# Patient Record
Sex: Female | Born: 1975 | Race: White | Hispanic: No | Marital: Married | Smoking: Former smoker
Health system: Southern US, Community
[De-identification: ages and names within clinical notes are randomized; demographics above are authoritative.]

## PROBLEM LIST (undated history)

## (undated) DIAGNOSIS — I82409 Acute embolism and thrombosis of unspecified deep veins of unspecified lower extremity: Secondary | ICD-10-CM

## (undated) DIAGNOSIS — I2699 Other pulmonary embolism without acute cor pulmonale: Secondary | ICD-10-CM

## (undated) DIAGNOSIS — N3941 Urge incontinence: Secondary | ICD-10-CM

## (undated) DIAGNOSIS — I4891 Unspecified atrial fibrillation: Secondary | ICD-10-CM

## (undated) DIAGNOSIS — T4145XA Adverse effect of unspecified anesthetic, initial encounter: Secondary | ICD-10-CM

## (undated) DIAGNOSIS — E039 Hypothyroidism, unspecified: Secondary | ICD-10-CM

## (undated) DIAGNOSIS — R7989 Other specified abnormal findings of blood chemistry: Secondary | ICD-10-CM

## (undated) DIAGNOSIS — Z87440 Personal history of urinary (tract) infections: Secondary | ICD-10-CM

## (undated) DIAGNOSIS — R222 Localized swelling, mass and lump, trunk: Secondary | ICD-10-CM

## (undated) DIAGNOSIS — T8859XA Other complications of anesthesia, initial encounter: Secondary | ICD-10-CM

## (undated) HISTORY — DX: Urge incontinence: N39.41

## (undated) HISTORY — PX: URETHRAL SLING: SHX2621

## (undated) HISTORY — DX: Personal history of urinary (tract) infections: Z87.440

## (undated) HISTORY — DX: Other specified abnormal findings of blood chemistry: R79.89

## (undated) HISTORY — PX: REMOVAL OF URINARY SLING: SHX6218

## (undated) HISTORY — PX: KNEE SURGERY: SHX244

## (undated) HISTORY — PX: REVISION URINARY SLING: SHX6213

## (undated) HISTORY — DX: Unspecified atrial fibrillation: I48.91

---

## 1999-05-16 HISTORY — PX: CHOLECYSTECTOMY: SHX55

## 2000-05-25 ENCOUNTER — Other Ambulatory Visit: Admission: RE | Admit: 2000-05-25 | Discharge: 2000-05-25 | Payer: Self-pay | Admitting: Obstetrics and Gynecology

## 2000-08-22 ENCOUNTER — Encounter: Payer: Self-pay | Admitting: Family Medicine

## 2000-08-22 ENCOUNTER — Encounter: Admission: RE | Admit: 2000-08-22 | Discharge: 2000-08-22 | Payer: Self-pay | Admitting: Family Medicine

## 2000-09-06 ENCOUNTER — Encounter: Admission: RE | Admit: 2000-09-06 | Discharge: 2000-09-06 | Payer: Self-pay | Admitting: Family Medicine

## 2000-09-06 ENCOUNTER — Encounter: Payer: Self-pay | Admitting: Family Medicine

## 2000-09-13 ENCOUNTER — Ambulatory Visit (HOSPITAL_COMMUNITY): Admission: RE | Admit: 2000-09-13 | Discharge: 2000-09-13 | Payer: Self-pay | Admitting: Family Medicine

## 2000-09-13 ENCOUNTER — Encounter: Payer: Self-pay | Admitting: Family Medicine

## 2000-09-24 ENCOUNTER — Encounter: Payer: Self-pay | Admitting: Family Medicine

## 2000-09-24 ENCOUNTER — Ambulatory Visit (HOSPITAL_COMMUNITY): Admission: RE | Admit: 2000-09-24 | Discharge: 2000-09-24 | Payer: Self-pay | Admitting: Family Medicine

## 2000-09-28 ENCOUNTER — Ambulatory Visit (HOSPITAL_COMMUNITY): Admission: RE | Admit: 2000-09-28 | Discharge: 2000-09-28 | Payer: Self-pay | Admitting: Gastroenterology

## 2000-11-05 ENCOUNTER — Observation Stay (HOSPITAL_COMMUNITY): Admission: RE | Admit: 2000-11-05 | Discharge: 2000-11-06 | Payer: Self-pay | Admitting: General Surgery

## 2000-11-05 ENCOUNTER — Encounter (INDEPENDENT_AMBULATORY_CARE_PROVIDER_SITE_OTHER): Payer: Self-pay

## 2000-11-11 ENCOUNTER — Encounter: Payer: Self-pay | Admitting: Surgery

## 2000-11-11 ENCOUNTER — Emergency Department (HOSPITAL_COMMUNITY): Admission: EM | Admit: 2000-11-11 | Discharge: 2000-11-11 | Payer: Self-pay | Admitting: Emergency Medicine

## 2000-12-18 ENCOUNTER — Ambulatory Visit (HOSPITAL_COMMUNITY): Admission: RE | Admit: 2000-12-18 | Discharge: 2000-12-18 | Payer: Self-pay | Admitting: Gastroenterology

## 2001-05-09 ENCOUNTER — Encounter: Payer: Self-pay | Admitting: Obstetrics and Gynecology

## 2001-05-09 ENCOUNTER — Ambulatory Visit (HOSPITAL_COMMUNITY): Admission: RE | Admit: 2001-05-09 | Discharge: 2001-05-09 | Payer: Self-pay | Admitting: Obstetrics and Gynecology

## 2001-08-15 ENCOUNTER — Inpatient Hospital Stay (HOSPITAL_COMMUNITY): Admission: AD | Admit: 2001-08-15 | Discharge: 2001-08-15 | Payer: Self-pay | Admitting: Obstetrics and Gynecology

## 2001-09-19 ENCOUNTER — Inpatient Hospital Stay (HOSPITAL_COMMUNITY): Admission: AD | Admit: 2001-09-19 | Discharge: 2001-09-19 | Payer: Self-pay | Admitting: Obstetrics and Gynecology

## 2001-09-23 ENCOUNTER — Inpatient Hospital Stay (HOSPITAL_COMMUNITY): Admission: AD | Admit: 2001-09-23 | Discharge: 2001-09-25 | Payer: Self-pay | Admitting: Obstetrics and Gynecology

## 2001-11-04 ENCOUNTER — Other Ambulatory Visit: Admission: RE | Admit: 2001-11-04 | Discharge: 2001-11-04 | Payer: Self-pay | Admitting: Obstetrics and Gynecology

## 2003-07-24 ENCOUNTER — Other Ambulatory Visit: Admission: RE | Admit: 2003-07-24 | Discharge: 2003-07-24 | Payer: Self-pay | Admitting: Obstetrics and Gynecology

## 2003-12-25 ENCOUNTER — Inpatient Hospital Stay (HOSPITAL_COMMUNITY): Admission: AD | Admit: 2003-12-25 | Discharge: 2003-12-25 | Payer: Self-pay | Admitting: Obstetrics and Gynecology

## 2003-12-27 ENCOUNTER — Inpatient Hospital Stay (HOSPITAL_COMMUNITY): Admission: AD | Admit: 2003-12-27 | Discharge: 2003-12-27 | Payer: Self-pay | Admitting: Obstetrics and Gynecology

## 2004-01-03 ENCOUNTER — Inpatient Hospital Stay (HOSPITAL_COMMUNITY): Admission: AD | Admit: 2004-01-03 | Discharge: 2004-01-05 | Payer: Self-pay | Admitting: Obstetrics and Gynecology

## 2005-05-04 ENCOUNTER — Emergency Department (HOSPITAL_COMMUNITY): Admission: EM | Admit: 2005-05-04 | Discharge: 2005-05-04 | Payer: Self-pay | Admitting: Emergency Medicine

## 2005-12-13 ENCOUNTER — Other Ambulatory Visit: Admission: RE | Admit: 2005-12-13 | Discharge: 2005-12-13 | Payer: Self-pay | Admitting: Obstetrics and Gynecology

## 2006-07-13 ENCOUNTER — Inpatient Hospital Stay (HOSPITAL_COMMUNITY): Admission: AD | Admit: 2006-07-13 | Discharge: 2006-07-13 | Payer: Self-pay | Admitting: Obstetrics and Gynecology

## 2006-07-23 ENCOUNTER — Inpatient Hospital Stay (HOSPITAL_COMMUNITY): Admission: AD | Admit: 2006-07-23 | Discharge: 2006-07-23 | Payer: Self-pay | Admitting: Obstetrics and Gynecology

## 2006-07-24 ENCOUNTER — Inpatient Hospital Stay (HOSPITAL_COMMUNITY): Admission: AD | Admit: 2006-07-24 | Discharge: 2006-07-24 | Payer: Self-pay | Admitting: Obstetrics and Gynecology

## 2006-07-25 ENCOUNTER — Inpatient Hospital Stay (HOSPITAL_COMMUNITY): Admission: AD | Admit: 2006-07-25 | Discharge: 2006-07-25 | Payer: Self-pay | Admitting: Obstetrics and Gynecology

## 2006-08-24 ENCOUNTER — Inpatient Hospital Stay (HOSPITAL_COMMUNITY): Admission: AD | Admit: 2006-08-24 | Discharge: 2006-08-28 | Payer: Self-pay | Admitting: Obstetrics and Gynecology

## 2006-08-26 ENCOUNTER — Encounter (INDEPENDENT_AMBULATORY_CARE_PROVIDER_SITE_OTHER): Payer: Self-pay | Admitting: Specialist

## 2007-11-24 ENCOUNTER — Ambulatory Visit (HOSPITAL_COMMUNITY): Admission: AD | Admit: 2007-11-24 | Discharge: 2007-11-24 | Payer: Self-pay | Admitting: Obstetrics and Gynecology

## 2007-11-24 ENCOUNTER — Encounter (INDEPENDENT_AMBULATORY_CARE_PROVIDER_SITE_OTHER): Payer: Self-pay | Admitting: Obstetrics and Gynecology

## 2009-04-11 ENCOUNTER — Emergency Department: Payer: Self-pay | Admitting: Unknown Physician Specialty

## 2009-05-15 HISTORY — PX: THYROIDECTOMY: SHX17

## 2010-04-28 ENCOUNTER — Ambulatory Visit: Payer: Self-pay | Admitting: Otolaryngology

## 2010-04-30 ENCOUNTER — Emergency Department: Payer: Self-pay | Admitting: Unknown Physician Specialty

## 2010-05-01 ENCOUNTER — Observation Stay: Payer: Self-pay | Admitting: Otolaryngology

## 2010-07-13 ENCOUNTER — Ambulatory Visit: Payer: Self-pay | Admitting: Otolaryngology

## 2010-09-27 NOTE — Op Note (Signed)
Penny [name], Penny [name]                ACCOUNT NO.:  [id number]   MEDICAL RECORD NO.:  [id number]          PATIENT TYPE:  AMB   LOCATION:  SDC                           FACILITY:  [facility]   PHYSICIAN:  Malachi Pro. Ambrose Mantle, M.D. DATE OF BIRTH:  [birth year]   DATE OF PROCEDURE:  11/24/2007  DATE OF DISCHARGE:                               OPERATIVE REPORT   PREOPERATIVE DIAGNOSIS:  Incomplete abortion.   POSTOPERATIVE DIAGNOSIS:  Incomplete abortion.   OPERATION:  Suction D&C.   OPERATOR:  Malachi Pro. Ambrose Mantle, MD   ANESTHESIA:  General.   The patient had come to the emergency room with hemorrhage.  I removed  quite a bit of clots from the vagina, and the cervix was not open.  While we were preparing for the operating room, the patient did pass  some what appeared to be placental tissue.  I was not certain that she  had passed all the tissue, so we brought her to the operating room,  placed under satisfactory general anesthesia, and placed in lithotomy  position.  The vulva, vagina, and perineum were prepped with Betadine  solution and draped as a sterile field.  The cervix was exposed.  The  uterus was examined, anterior slightly enlarged and the adnexa were free  of masses.  The cervix was open.  I grasped the cervix with a tenaculum,  used a sharp curette to enter the endometrial cavity, reached the  fundus, obtained a little bit of tissue with the sharp curette, then I  did a suction D&C.  There was a little bit of tissue left.  I continued  to do a little bit more suction, then replaced the sharp curette, and  found no additional tissue.  I did a final circuit with a suction #8  curved and terminated the procedure.  The patient seemed to tolerate the  procedure well and was returned to recovery room in satisfactory  condition.  We will check her blood type prior to discharge.      Malachi Pro. Ambrose Mantle, M.D.  Electronically Signed     TFH/MEDQ  D:  11/24/2007  T:  11/25/2007  Job:   [id number]

## 2010-09-30 NOTE — Op Note (Signed)
[facility]  Patient:    Penny [name], Penny [name]                      MRN: [id number] Proc. Date: 11/05/00 Adm. Date:  [id number] Attending:  Janalyn Rouse CC:         Florencia Reasons, M.D.   Operative Report  PREOPERATIVE DIAGNOSIS:  Biliary dyskinesia with strong family history of cholelithiasis.  POSTOPERATIVE DIAGNOSIS:  Biliary dyskinesia with strong family history of cholelithiasis.  OPERATION:  Laparoscopic cholecystectomy.  SURGEON:  Rose Phi. Maple Hudson, M.D.  ASSISTANT:  Currie Paris, M.D.  ANESTHESIA:  General.  DESCRIPTION OF PROCEDURE:  After suitable general endotracheal anesthesia was induced, the patient was placed in the supine position and the abdomen prepped and draped in the usual fashion.  A short transverse inferior umbilical incision was made with dissection to the midline fascia.  The fascia was incised, grasped and elevated, and the peritoneum entered.  Finger inspection revealed no adhesions.  A 0 Vicryl pursestring was then placed in the fascia. The Hasson cannula was inserted and fixed in place and then the abdomen insufflated with CO2.  Under direct vision, a 10-11 trocar was inserted in the epigastrium and two 5 mm trocars in right upper quadrant.  We were able to grasp the fundus of the gallbladder, and it was somewhat thick-walled but not inflamed.  We elevated it and then grasped the infundibulum and manipulated it.  There were no adhesions.  We opened up the peritoneum at the porta hepatis and exposed the cystic duct and the cystic artery.  After clearly identifying these, they were triply clipped and divided, and the gallbladder dissected free from the liver bed and then extracted through the umbilical incision.  There was no bleeding. We thoroughly irrigated the right upper quadrant.  We then carefully again inspected the right upper quadrant and then removed the trocars and tied the pursestring at the  umbilicus.  All of the incisions were infiltrated with 0.25% Marcaine.  Steri-Strips used for the skin.  Dressings applied.  Patient transferred to the recovery room in satisfactory condition having tolerated the procedure well. DD:  11/05/00 TD:  11/05/00 Job: [id number]

## 2010-09-30 NOTE — Discharge Summary (Signed)
[facility]  Patient:    Penny [name], Penny [name] Visit Number: [id number] MRN: [id number]          Service Type: OBS Location: 910A [id number] [id number] Attending Physician:  Michaele Offer Dictated by:   Alvino Chapel, M.D. Admit Date:  09/23/2001 Discharge Date: 09/25/2001                             Discharge Summary  DISCHARGE DIAGNOSES:          1. Term pregnancy at 37+ weeks, delivered.                               2. Status post normal spontaneous vaginal                                  delivery.  DISCHARGE MEDICATIONS:        1. Motrin 600 mg p.o. every six hours.                               2. Percocet 1-2 tablets p.o. every four hours                                  p.r.n.  DISCHARGE FOLLOWUP:           The patient is to follow up in six weeks for her routine postpartum exam.  HISTORY OF PRESENT ILLNESS:   The patient is a 35 year old G1, P0 who was admitted at 37+ weeks with a complaint of regular contractions and cervical change.  She had no leakage of fluid on admission and was admitted at 4-cm dilated and given an epidural.  Prenatal care made complicated by abdominal pain consistent with esophageal spasms, which was treated with nitroglycerin p.r.n. and Zantac.  Also, there was some anemia treated with iron.  PRENATAL LABORATORY DATA:     AB+, antibody negative, RPR nonreactive, rubella immune.  Hepatitis B surface antigen negative.  Gonorrhea negative, chlamydia negative, triple screen normal.  Group B Strep negative.  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  PAST SURGICAL HISTORY:        Knee surgery in 1998, a cholecystectomy in 2002.  PAST GYNECOLOGICAL HISTORY:   Chlamydia treated in 2001.  ALLERGIES:                    1. SULFA.                               2. CODEINE.  MEDICATIONS:                  1. Nitroglycerin p.r.n.                               2.  Zantac 150 mg b.i.d.  SOCIAL HISTORY:               The patient is separated; however, the father of the baby is here and involved.  PHYSICAL EXAMINATION:  VITAL SIGNS:                  She is afebrile with stable vital signs.  Fetal heart is reactive.  HOSPITAL COURSE:              Shortly after admission, she was examined and found to be completely effaced, 5-6 cm dilated, at a 0 station.  She had assisted rupture of membranes with clear fluid obtained and from that point progressed quickly to complete dilation.  She pushed approximately 45 minutes with a normal spontaneous vaginal delivery of a vigorous female infant over a small second-degree laceration.  Apgars were 8 and 9.  Weight was 6 pounds 13 ounces.  Placenta delivered spontaneously with a small second-degree laceration repaired with 2-0 Vicryl.  Estimated blood loss was 350 cc.  Her Cervix and rectum were intact.  The patient did very well postpartum and on postpartum day #2 was afebrile with stable vital signs.  Her bleeding was well controlled and bleeding normal.  Therefore, she was felt stable for discharge home.  The patient will followup in the office as previously stated. Dictated by:   Alvino Chapel, M.D. Attending Physician:  Michaele Offer DD:  09/25/01 TD:  09/27/01 Job: [id number]

## 2010-09-30 NOTE — Procedures (Signed)
[facility]  Patient:    Penny [name], Penny [name]                      MRN: [id number] Proc. Date: 09/28/00 Adm. Date:  [id number] Attending:  Rich Brave CC:         Joycelyn Rua, M.D., [facility]   Procedure Report  PROCEDURE:  Upper endoscopy.  INDICATIONS:  This is a 35 year old female with recurring severe epigastric pain which has now been going on for about a month on a virtually daily basis. The patient has not responded to trials of antipeptic therapy, and studies of the gallbladder have been negative.  FINDINGS:  Normal exam.  Minimal hiatal hernia present.  INFORMED CONSENT:  The nature, purpose and risks of the procedure have been discussed with the patient who provided written consent.  SEDATION:  Fentanyl 50 mcg and Versed 0.5 mg IV without arrhythmias or desaturation.  DESCRIPTION OF PROCEDURE:  The Olympus adult video endoscope was passed under direct vision.  The vocal cords looked normal.  The esophagus was easily entered and had entirely normal mucosa without evidence of reflux esophagitis, Barretts esophagus, varices, infection, or neoplasia.  No ring, stricture, or significant hiatal hernia was present, although I do think a 1 cm hiatal hernia was visualized.  The stomach was entered.  It contained no significant residual.  The gastric mucosa was unremarkable and had normal mucosa without evidence of gastritis, erosions, ulcers, polyps, or masses.  The retroflexed view of the cardia was unremarkable and in particular, there was no patulous character to the diaphragmatic hiatus.  The pylorus, duodenal bulb, and second duodenum were inspected and reinspected and appeared normal.  The scope was then removed from the patient.  No biopsies were obtained.  The patient tolerated the procedure well, and there were no apparent complications.  IMPRESSION:  Essentially normal upper endoscopy, without source of  recurring epigastric pain endoscopically evident.  PLAN: 1. Nulev for abdominal pain. 2. Empiric trial of Actigall. DD:  09/28/00 TD:  09/29/00 Job: [id number]

## 2010-09-30 NOTE — H&P (Signed)
[facility]  Patient:    Penny [name], Penny [name]                      MRN: [id number] Adm. Date:  [id number] Disc. Date: [id number] Attending:  Janalyn Rouse CC:         Florencia Reasons, M.D.   History and Physical  CHIEF COMPLAINT:  Abdominal pain.  HISTORY OF PRESENT ILLNESS:  Penny [name] is a 35 year old woman who underwent laparoscopic cholecystectomy six days ago by Rose Phi. Maple Hudson, M.D. She had been having episodes similar to her current one prior to that and apparently had had a complete workup, and no gallstones were found, but it was thought that the symptoms were mostly likely biliary since she had had multiple family members with similar episodes all of whom had a gallbladder disease. She underwent cholecystectomy. A note of the past showed mild chronic cholecystitis but no cholelithiasis.  The patient was discharged the day following surgery. Did well until about 40 hours ago when she had another episode similar to her preoperative pain which was high epigastric, almost at the xiphoid area pain. Lasted several hours, then went away. She was okay until about 3 this morning when she woke up with another episode. She took some ibuprofen, went back sleep, slept for awhile, woke up with a third episode which by the time I saw her in the emergency room had resolved. She does not really have a lot of nausea or vomiting with these episodes. No change in her bowel habits. No fever or chills, but she says they are debilitating, bringing her to tears.  PAST MEDICAL HISTORY, FAMILY HISTORY, REVIEW OF SYSTEMS:  Are unremarkable and noted in the old chart. Not redictated here.  PHYSICAL EXAMINATION:  GENERAL:  The patient is alert and oriented in no distress currently.  VITAL SIGNS:  Blood pressure 133/67, pulse 67, respirations 16, temperature 96.6.  HEENT:  Head is normocephalic. Eyes nonicteric. Pupils round and regular.  LUNGS:  Clear to  auscultation.  HEART:  Regular. No murmurs, rubs, or gallops.  ABDOMEN:  Nicely healing laparoscopic cholecystectomy incisions. No evidence of any infection. The abdomen is nondistended, soft, and completely nontender currently. Bowel sounds are normal.  LABORATORY DATA:  Laboratories and hepatobiliary scan currently pending.  IMPRESSION:  Recurrent episodic sub xiphoid pain of uncertain etiology.  PLAN:  We will review labs once they are done. If she has another episode, we will try a sublingual nitroglycerin to see if she is having some degree of esophageal spasm. Might be relieved. Assuming her labs are negative, we will probably need to get Florencia Reasons, M.D. back involved in her care.DD: 11/11/00 TD:  11/11/00 Job: [id number]

## 2010-09-30 NOTE — Procedures (Signed)
[place]  Patient:    Penny [name], Penny [name]                      MRN: [id number] Proc. Date: 12/21/00 Adm. Date:  [id number] Disc. Date: [id number] Attending:  Orland Mustard CC:         Joycelyn Rua, M.D.  Rose Phi. Maple Hudson, M.D.   Procedure Report  PROCEDURE:  Esophageal manometry.  ENDOSCOPIST:  Florencia Reasons, M.D.  INDICATIONS:  [age]-year-old female with recurring nonspecific upper abdominal and chest pain status post laparoscopic cholecystectomy by Dr. Francina Ames about six weeks ago.  FINDINGS:  Mild nonspecific esophageal motility disorder.  No major abnormalities identified and no definite source of symptoms seen.  DESCRIPTION OF PROCEDURE:  The procedure was done as an outpatient by the endoscopy nurse at the [facility] using a transnasal passage of a solid state manometry catheter.  It was advanced to the stomach and withdrawn gradually to obtain the readings.  FINDINGS: 1. Lower esophageal sphincter:  The lower esophageal sphincter had slightly    above-normal resting tone of 48 mmHg (normal up to 45 mmHg) with normal    relaxation with swallows. 2. Esophageal body:  Approximately 10% of contractions were transmitted and    another 10% were retrograde contractions, consistent with a mild    nonspecific motility disorder of the esophagus.  However, amplitude and    duration of contractions were normal, and no aperistaltic repetitive or    spontaneous activity was noted. 3. Upper esophageal sphincter:  The upper esophageal sphincter pressure was    normal at 68 mmHg.  However, coordination with swallows can not be    discerned from the available tracings.  IMPRESSION:  Mild nonspecific esophageal motility disorder characterized by some occasional retrograde and nontransmitted contractions.  The relevance to her symptoms is not known.  PLAN:  Continue sublingual nitroglycerin and acid control.  Consider  trial of calcium channel blocker if symptoms persist. DD:  12/21/00 TD:  12/23/00 Job: [id number]

## 2010-09-30 NOTE — Discharge Summary (Signed)
NAMETELESHA, Penny [name]                ACCOUNT NO.:  [id number]   MEDICAL RECORD NO.:  [id number]          PATIENT TYPE:  INP   LOCATION:  9316                          FACILITY:  [facility]   PHYSICIAN:  Sherron Monday, MD        DATE OF BIRTH:  1976/02/07   DATE OF ADMISSION:  08/24/2006  DATE OF DISCHARGE:  08/28/2006                               DISCHARGE SUMMARY   ADMISSION DIAGNOSES:  Intrauterine pregnancy at 33+ weeks, premature  rupture of membranes, delivered via spontaneous vaginal delivery.   HISTORY OF PRESENT ILLNESS:  A 35 year old, G3, P1-1-0-2, at 33-5/7  weeks with rupture of membranes while in the shower for clear fluid.  In  the office, there was positive Nitrazine, positive pool. She states she  has had good fetal movement, no vaginal bleeding and occasional  contractions. She previously received betamethasone for preterm  dilatation of being 2, 50 and -2 at approximately 26 weeks. She has been  having biweekly NST secondary to 2-vessel cord, small for gestational  age and preterm dilatation.   PAST MEDICAL HISTORY:  1. 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  2. Hypothyroidism - not on medication.   PAST SURGICAL HISTORY:  1. Significant for cholecystectomy in 2002.  2. Meniscus repair in 1997.   PAST OBSTETRICAL/GYNECOLOGICAL HISTORY:  G1 was a term spontaneous  vaginal delivery of a female infant weighing 6 pounds 13 ounces. G2 was  a 36-week spontaneous vaginal delivery of a female infant weighing 6  pounds 10 ounces. This pregnancy was complicated with preterm labor. G3  is her present pregnancy. She has no history of any abnormal Pap smears;  her last was in August of 2007. She has a history of chlamydia in 2001.   Her medications include prenatal vitamins.   ALLERGIES:  TO SULFA AND CODEINE.   SOCIAL HISTORY:  Denies alcohol, tobacco or drug use. She is married.   FAMILY HISTORY:  Her mother has ovarian cancer, congestive heart failure  in paternal  grandfather, hypertension in her mother, diabetes in  maternal grandmother.   Her LMP was December 31, 2005, giving her an [facility] of Oct 07, 2006. A 9-week  ultrasound was consistent with dates of her last menstrual period and  showed fetal heart tones. She had a ultrasound at 12 weeks and 5 days  revealing a normal neural thickness, questionable circumvallate placenta  and good fetal heart tones. On December 28, she had an ultrasound at 81-  5/7 weeks revealing normal anatomy, two-vessel cord, right lateral  placenta and a female infant. March 4, she had an ultrasound at 28-2/7  weeks revealing normal AFI, normal placenta, BPP of 8/8, some cervical  funneling with a cervical length of 2.8 with fundal pressure. An  ultrasound on August 10, 2006, 31-5/7, with an estimated weight of 1724  grams, normal AFI, cervix was stable and placenta was right lateral.   PRENATAL LABORATORY DATA:  Hemoglobin 12.9, platelets 230,000. She is AB  positive. Antibody screen negative. Gonorrhea negative. Chlamydia  negative. RPR nonreactive. Rubella negative. Cystic fibrosis screen  negative. Hepatitis  B surface antigen negative. TSH within normal  limits. HIV negative. AFP was within normal limits.   On admission physical exam, she is afebrile with stable vital signs.  Benign exam. Fetal heart tones were 150s with moderate variability. No  contractions. Visually, her cervix was unchanged. Estimated fetal weight  by ultrasound was 4 pounds 13 ounces with a normal AFI; however, she  continued to have leaking.   The plan was to admit her and give her ampicillin and erythromycin for  latency, induce her at 34 weeks. Have NICU discuss with her delivery of  a 34-week infant. The patient voiced understanding to all of this. She  had a relatively noneventful hospitalization.  On the morning of  hospital day 3 when she was actually 34 weeks when she at 3 a.m. began  complaining of increased discomfort and contractions  were noticed. She  was given an IV fluid bolus. However, with request of pain medicine, her  cervix was checked and found to be 4 cm. She was transferred to labor  and delivery and got an epidural. She was comfortable with that. Fetal  heart tones were 130s with good variability and occasional variable  decelerations with irregular contractions. Her vaginal exam at 5:00 in  the morning was 9 cm dilated, 100% effaced and +1 station. The NICU was  made aware of her progress. She rapidly progressed to complete/complete,  +2, pushed 3 times to deliver a viable female infant at 5:38 with Apgars  of 8 at one minute and 9 at five minutes and a weight of 2219 grams  which is 4 pounds 14 ounces. Infant delivered OP to LOT. Placenta  expressed intact at 5:45. Placenta was sent to pathology. Superficial  perineal laceration was repaired with 3-0 Vicryl. Labial skin tag was  removed at patient request. EBL was less than 500 mL. Her postpartum  course was relatively uncomplicated. She remained afebrile throughout.  Her hemoglobin decreased from 10.4 to 9.1. She was discharged to home on  postpartum day 2 with routine discharge instructions and numbers to call  with any questions or problems. She was also given prescription for  Motrin, Vicodin, iron and prenatal vitamins.   Her discharge information:  She is AB positive, rubella immune. Plans to  breast feed. Is unsure about contraception. Will further discuss this at  her 6-week checkup.      Sherron Monday, MD  Electronically Signed     JB/MEDQ  D:  08/28/2006  T:  08/28/2006  Job:  [id number]

## 2010-09-30 NOTE — Discharge Summary (Signed)
NAME:  Penny [name], Penny [name]                          ACCOUNT NO.:  [id number]   MEDICAL RECORD NO.:  [id number]                   PATIENT TYPE:  INP   LOCATION:  9143                                 FACILITY:  [facility]   PHYSICIAN:  Zenaida Niece, M.D.             DATE OF BIRTH:  Dec 20, 1975   DATE OF ADMISSION:  01/03/2004  DATE OF DISCHARGE:                                 DISCHARGE SUMMARY   ADMISSION DIAGNOSES:  1. Intrauterine pregnancy at 36 weeks.  2. Preterm labor.  3. Hypothyroidism.   DISCHARGE DIAGNOSES:  1. Intrauterine pregnancy at 36 weeks.  2. Preterm labor.  3. Hypothyroidism.   PROCEDURES:  On January 03, 2004 she had spontaneous vaginal delivery.   HISTORY AND PHYSICAL:  This is a 35 year old white female gravida 2 para 1-0-  0-1 with an EGA of [redacted] weeks by a 9-week ultrasound with a due date of  January 30, 2004 who presents with a complaint of regular contractions.  Evaluation in triage revealed her cervix to be 5 cm dilated with regular  contractions and she was admitted.  Prenatal care complicated by preterm  contractions and dilation.  Prenatal labs:  Blood type is AB positive with a  negative antibody screen, RPR nonreactive, rubella immune, hepatitis B  surface antigen negative, gonorrhea and chlamydia negative, triple screen  normal, and group B strep is negative per the patient.  OB history:  In  2003, a vaginal delivery at 37+ weeks, 6 pounds 13 ounces, no complications.  GYN history:  History of chlamydia in 2001.  Past medical history:  History  of a positive TB test with a negative chest x-ray and she has received  antibiotic prophylaxis.  She has a history of hypothyroidism.  Past surgical  history:  Knee surgery and cholecystectomy.  Allergies to SULFA and CODEINE.  Medications:  Synthroid 50 mcg daily.  Physical exam:  She is afebrile with  stable vital signs, fetal heart tracing reactive with contractions every 5-7  minutes.  Abdomen gravid,  nontender, with an estimated fetal weight of 6-and-  a-half pounds.  Cervix is 5, 50, -2, with a vertex presentation.   HOSPITAL COURSE:  The patient was admitted and continued to contract.  However, her contractions spaced out and she was started on Pitocin.  She  initially received a dose of penicillin for being preterm with an unknown  group B strep.  However, she reports that she was called and told that her  group B strep was negative so no more penicillin was given.  Once her  contractions were closer and the baby had a lower station, amniotomy was  performed which revealed clear fluid.  She received an epidural, progressed  to complete, pushed well, and on the morning of August 21 had a vaginal  delivery of a viable female infant with Apgars of 9 and 10 that weighed 6  pounds 10 ounces.  Placenta delivered spontaneous and was intact.  She had a  first degree laceration repaired with 3-0 Vicryl and estimated blood loss  was less than 500 mL.  Postpartum she had no significant complications.  Predelivery hemoglobin 10.9, postoperative delivery 10.3.  On postpartum day  #2 she was stable for discharge home.   DISCHARGE INSTRUCTIONS:  1. Regular diet, pelvic rest, follow up in 6 weeks.  2. Medications are over-the-counter ibuprofen as needed.  3. She is given our discharge pamphlet.                                               Zenaida Niece, M.D.    TDM/MEDQ  D:  01/05/2004  T:  01/05/2004  Job:  [id number]

## 2011-02-09 LAB — CBC
HCT: 36.1
Hemoglobin: 12.4
RBC: 4.08
RDW: 13

## 2011-02-09 LAB — DIFFERENTIAL
Basophils Absolute: 0
Eosinophils Relative: 2
Lymphocytes Relative: 16
Monocytes Absolute: 0.5
Monocytes Relative: 6
Neutro Abs: 6.7

## 2011-10-04 IMAGING — US ABDOMEN ULTRASOUND
1 series · 17 of 25 positions shown · non-contrast
Comparison: none

REASON FOR EXAM: abd pain
COMMENTS:   May transport without cardiac monitor

PROCEDURE:     US  - US ABDOMEN GENERAL SURVEY  - April 11, 2009  [DATE]
RESULT:     Comparison: None
TECHNIQUE: Multiple gray-scale and color-flow Doppler images of the abdomen
are presented for review.

[Series 1: abdomen ultrasound · 17 of 56 slices shown]
[im 1/56]
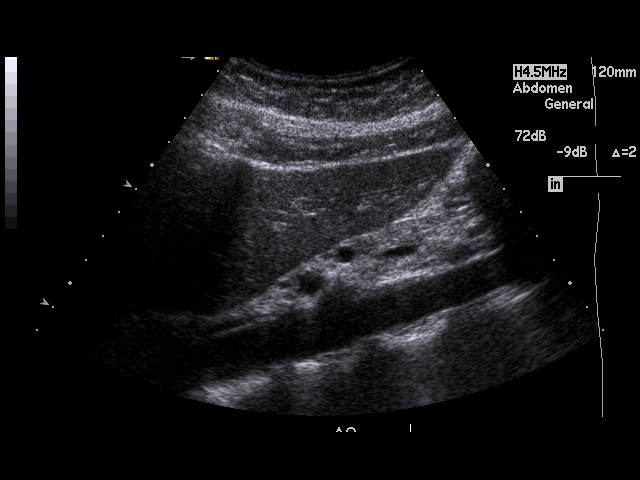
[im 5/56]
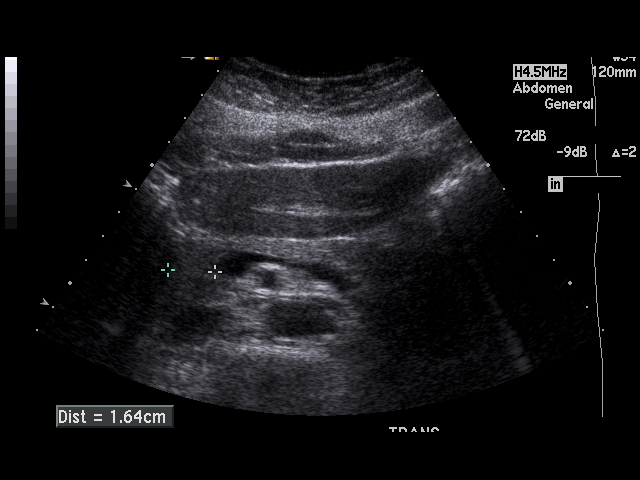
[im 7/56]
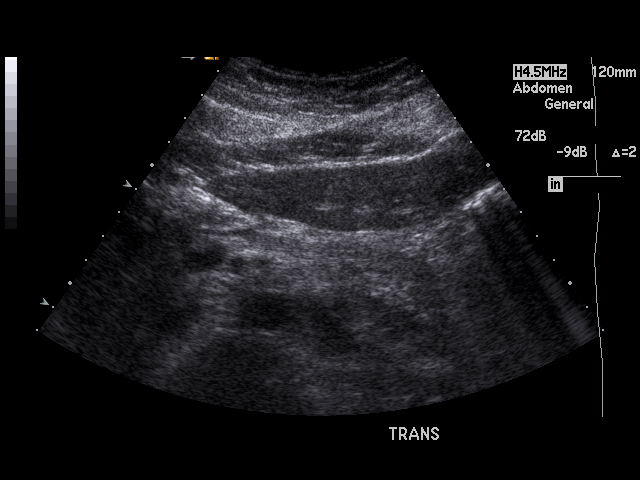
[im 12/56]
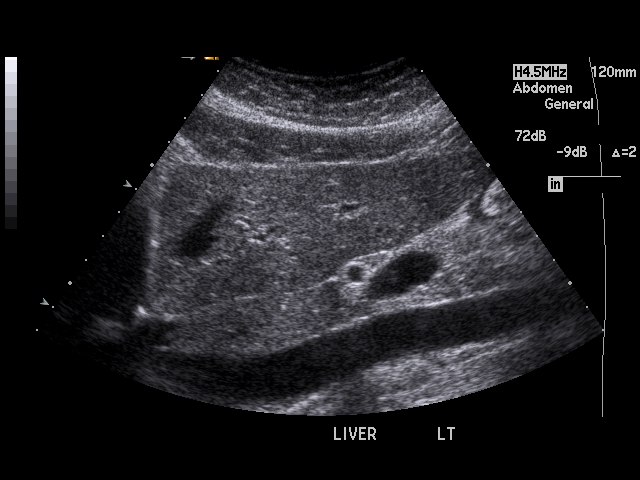
[im 14/56]
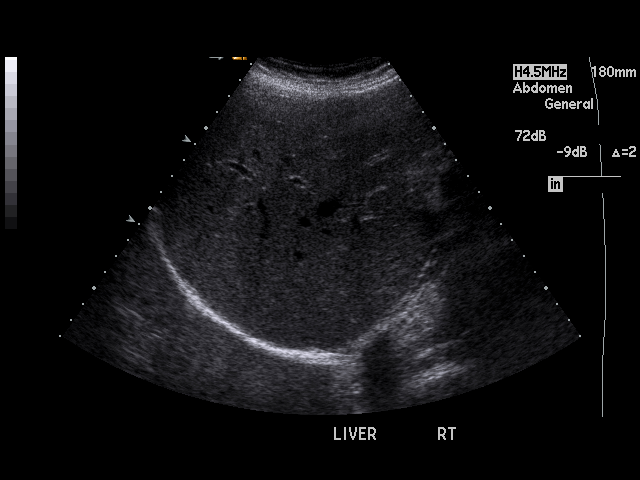
[im 19/56]
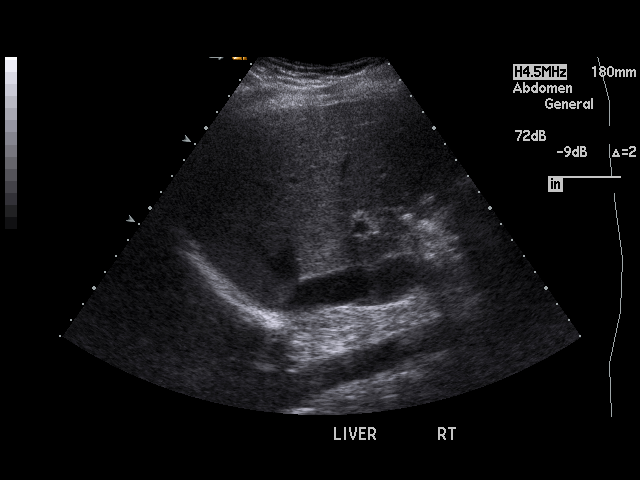
[im 21/56]
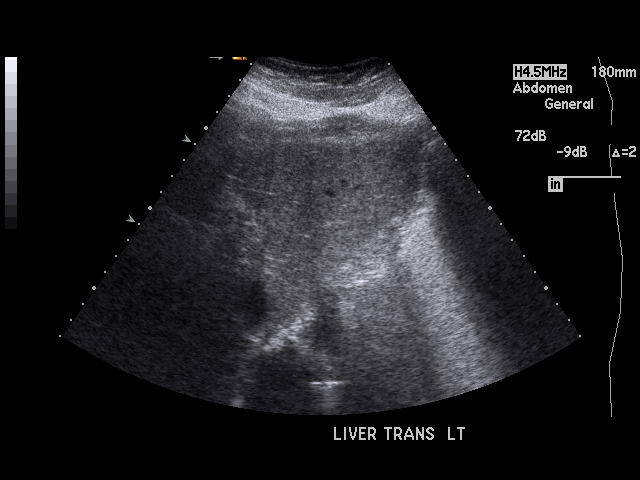
[im 26/56]
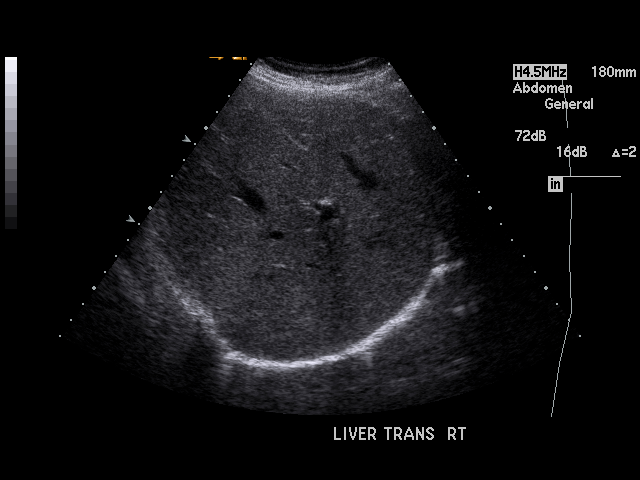
[im 28/56]
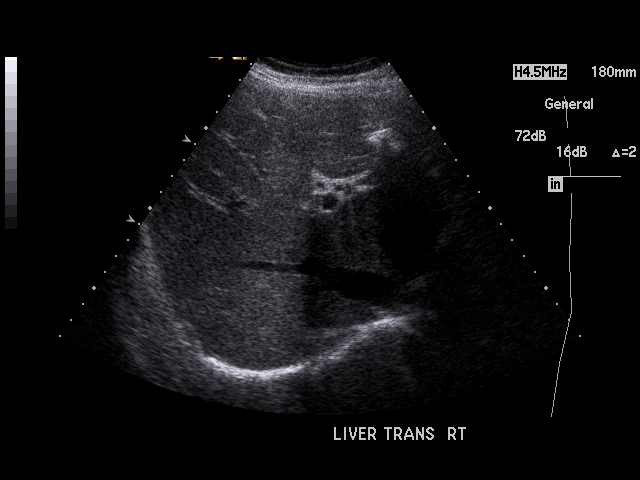
[im 30/56]
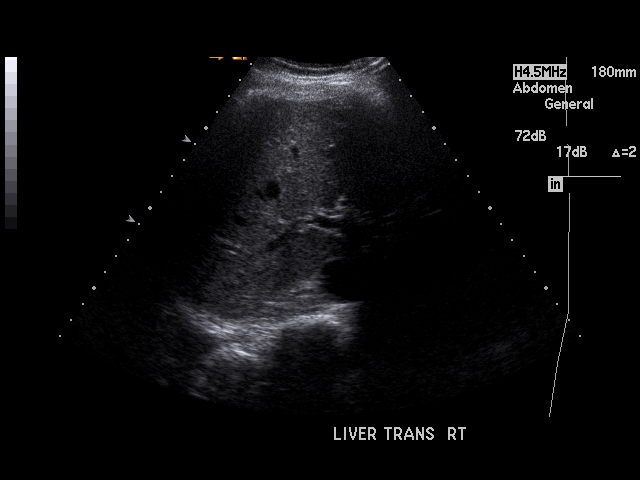
[im 35/56]
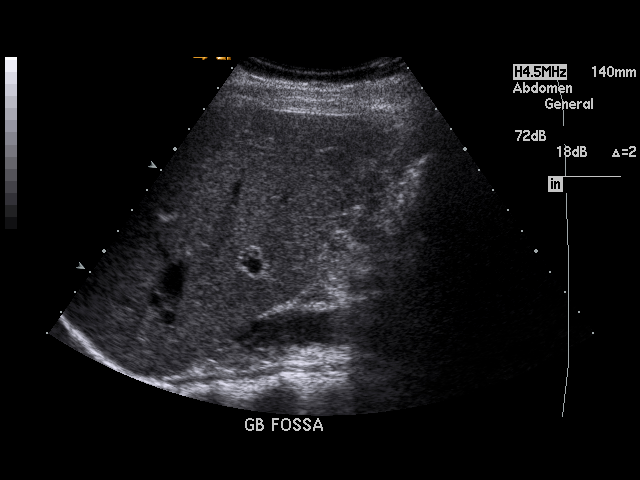
[im 37/56]
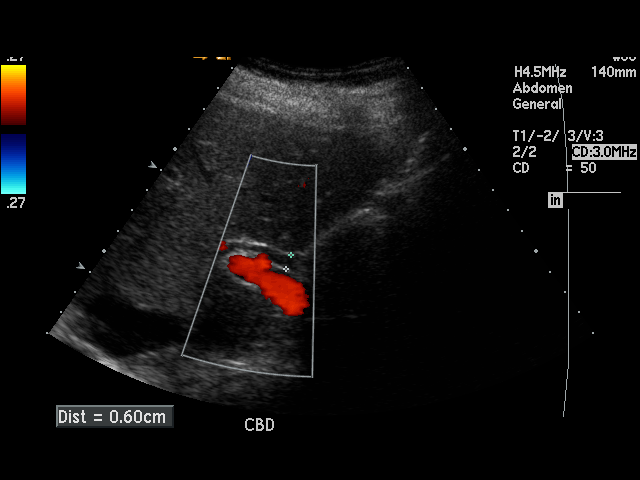
[im 42/56]
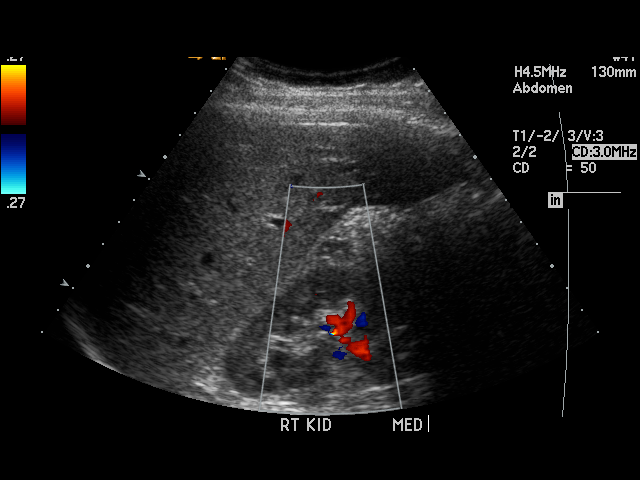
[im 44/56]
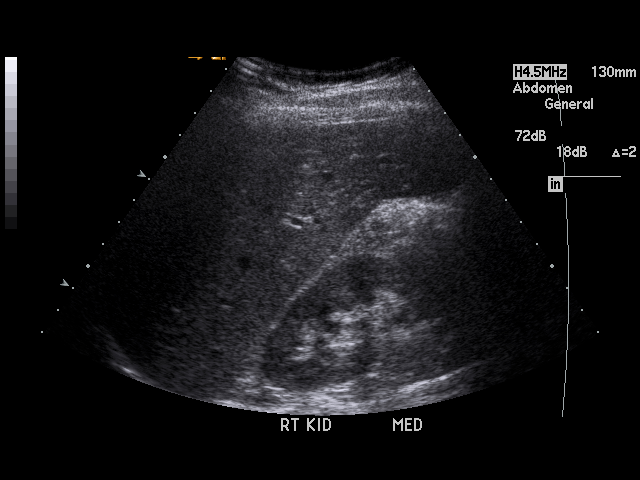
[im 49/56]
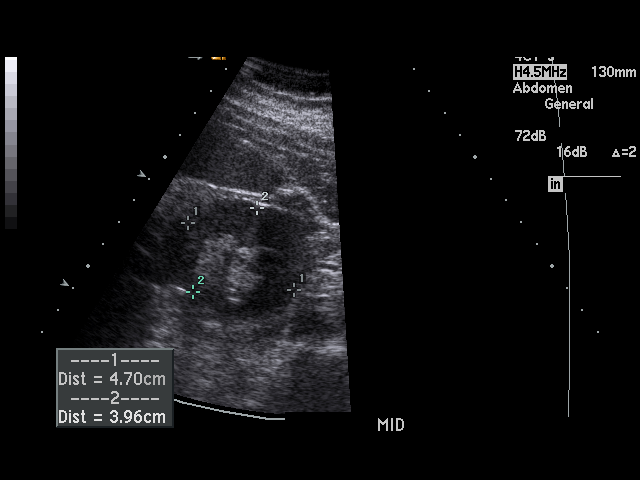
[im 51/56]
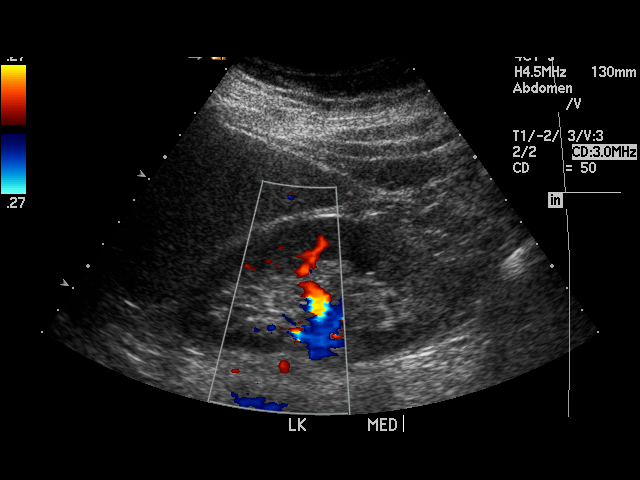
[im 56/56]
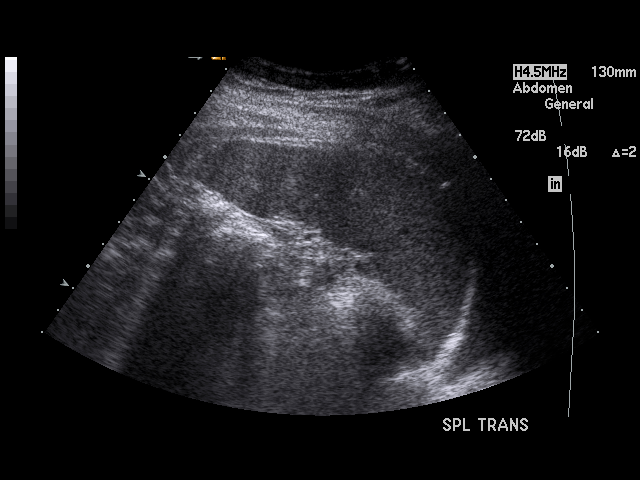

[17 of 25 positions shown; findings below may reference images not displayed]

FINDINGS: Visualized portions of the liver demonstrate normal echogenicity and normal
contours. The liver is without evidence of a focal hepatic lesion.

There is evidence of prior cholecystectomy. There is no intra- or
extrahepatic biliary ductal dilatation. The common duct measures 6 mm in
maximal diameter.

The visualized portion of the pancreas is normal in echogenicity. The spleen
is unremarkable. Bilateral kidneys are normal in echogenicity and size. The
right kidney measures 10.2 cm. The left kidney measures 11.4 cm. There are
no renal calculi or hydronephrosis. The abdominal aorta and IVC are
unremarkable.
IMPRESSION: Prior cholecystectomy. Otherwise unremarkable pelvic ultrasound.

## 2012-07-10 ENCOUNTER — Ambulatory Visit: Payer: Self-pay | Admitting: Internal Medicine

## 2016-09-11 ENCOUNTER — Encounter: Payer: Self-pay | Admitting: Emergency Medicine

## 2016-09-11 DIAGNOSIS — Y9389 Activity, other specified: Secondary | ICD-10-CM | POA: Diagnosis not present

## 2016-09-11 DIAGNOSIS — Y929 Unspecified place or not applicable: Secondary | ICD-10-CM | POA: Diagnosis not present

## 2016-09-11 DIAGNOSIS — M25561 Pain in right knee: Secondary | ICD-10-CM | POA: Diagnosis not present

## 2016-09-11 DIAGNOSIS — W108XXA Fall (on) (from) other stairs and steps, initial encounter: Secondary | ICD-10-CM | POA: Diagnosis not present

## 2016-09-11 DIAGNOSIS — Y990 Civilian activity done for income or pay: Secondary | ICD-10-CM | POA: Diagnosis not present

## 2016-09-11 DIAGNOSIS — S8991XA Unspecified injury of right lower leg, initial encounter: Secondary | ICD-10-CM | POA: Diagnosis present

## 2016-09-11 DIAGNOSIS — F17200 Nicotine dependence, unspecified, uncomplicated: Secondary | ICD-10-CM | POA: Insufficient documentation

## 2016-09-11 NOTE — ED Triage Notes (Signed)
Patient to ER for c/o right knee pain after fall at work. States she was only 1-2 steps up from ground, heard a pop before she hit ground. Patient works for [employer]. WC profile reviewed, no screening needed. Patient arrives with ice pack in place.

## 2016-09-12 ENCOUNTER — Emergency Department: Payer: Worker's Compensation

## 2016-09-12 ENCOUNTER — Emergency Department
Admission: EM | Admit: 2016-09-12 | Discharge: 2016-09-12 | Disposition: A | Discharge status: Home / Self Care | Payer: Worker's Compensation | Attending: Emergency Medicine | Admitting: Emergency Medicine

## 2016-09-12 ENCOUNTER — Encounter: Payer: Self-pay | Admitting: Medical

## 2016-09-12 ENCOUNTER — Ambulatory Visit: Payer: Self-pay | Admitting: Medical

## 2016-09-12 VITALS — BP 118/78 | HR 80 | Temp 97.6°F | Resp 16 | Ht 67.0 in | Wt 190.0 lb

## 2016-09-12 DIAGNOSIS — W19XXXA Unspecified fall, initial encounter: Secondary | ICD-10-CM

## 2016-09-12 DIAGNOSIS — E039 Hypothyroidism, unspecified: Secondary | ICD-10-CM

## 2016-09-12 DIAGNOSIS — M25561 Pain in right knee: Secondary | ICD-10-CM

## 2016-09-12 DIAGNOSIS — Z026 Encounter for examination for insurance purposes: Secondary | ICD-10-CM

## 2016-09-12 MED ORDER — TRAMADOL HCL 50 MG PO TABS: 50.0000 mg | ORAL_TABLET | Freq: Four times a day (QID) | ORAL | 0 refills | Status: DC | PRN

## 2016-09-12 NOTE — Patient Instructions (Signed)
Will refer to her knee doctor Dr. [facility].

## 2016-09-12 NOTE — Progress Notes (Signed)
   Subjective:    Patient ID: Penny [name], female    DOB: 1976/05/10, 41 y.o.   MRN: [id number]  HPI 41 yo female fell ( while working) forward going down steps and heard pop in right knee and had pain , did not hit the knee on the step or her head. Seen at [facility] Emergency Department  last night, xray showed no fracture or dislocation.  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 Taking Advil at home. She denies any numbness or tingling or radiation of pain.at rest her pain is  3/10 with movement it is a  5-6/10. Not wearing her knee immobilizer due to her driving. Prior ACL and meniscus repair about 20 years ago.     Review of Systems  Constitutional: Negative for chills and fever.  HENT: Negative for congestion, ear pain and sore throat.   Eyes: Negative for discharge and itching.  Respiratory: Negative for shortness of breath and wheezing.   Cardiovascular: Negative for chest pain.  Gastrointestinal: Negative for diarrhea, nausea and vomiting.  Endocrine: Negative for cold intolerance and heat intolerance.  Genitourinary: Negative for hematuria.  Musculoskeletal: Positive for joint swelling. Negative for back pain and neck pain.  Allergic/Immunologic: Negative for environmental allergies and food allergies.  Neurological: Negative for syncope and light-headedness.  Hematological: Negative for adenopathy.  Psychiatric/Behavioral: Negative for confusion and hallucinations.       Objective:   Physical Exam  Constitutional: She is oriented to person, place, and time. She appears well-developed and well-nourished.  HENT:  Head: Normocephalic and atraumatic.  Eyes: EOM are normal. Pupils are equal, round, and reactive to light.  Musculoskeletal: She exhibits edema and tenderness.  She exhibits no deformity.  Neurological: She is alert and oriented to person, place, and time.  Skin: Skin is warm and dry.  Psychiatric: She has a normal mood and affect. Her behavior is normal.  Nursing note and vitals reviewed. Right knee  With limited range of motion secondary to pain.Increased with extension. Swelling noted  anterior and superior to the patella. Pain located on the lateral right knee with weight bearing.Did not wear knee immobilizer because of her having to drive. Is using crutches. Negative drawers test medial and lateral , anterior and posterior. 2+ popliteal pulse and  2+ PT pulse.       Assessment & Plan:   Workers compensation,will refer patient to Dr.Collins ( this is her orthopedic doctor) for further evaluation and treatment due to patients prior surgery. Ace wrapped knee applied by me and showed patient how to wrap up the knee. She prefers tylenol or advil to narcotic pain medication. RICE , reviewed with patient.  Use crutches non-weight bearing. Work not with restrictions given to patient. Copy to chart.

## 2016-09-12 NOTE — Discharge Instructions (Signed)
Please follow-up with your orthopedic surgeon for further evaluation and treatment

## 2016-09-12 NOTE — ED Provider Notes (Signed)
[facility] Emergency Department Provider Note   ____________________________________________   First MD Initiated Contact with Patient 09/12/16 [phone]     (approximate)  I have reviewed the triage vital signs and the nursing notes.   HISTORY  Chief Complaint Fall    HPI Penny [name] is a 41 y.o. female who comes into the hospital today the pain after a fall. The patient reports that she fell going down the steps. She is unsure if she may have missed a step and slipped but she went down approximately one to 2 steps. She fell forward in her entire body was floor. She denies out right she reports that as she was falling she felt a pop in her right knee. The patient has had surgery on this right hip proximal she reports that she iced it for some time and then took some chest pain 3-4 out of 10 in intensity. She reports is not very swollen but she was concerned. She decided to come into the hospital today for evaluation.   History reviewed. No pertinent past medical history.  There are no active problems to display for this patient.   Past Surgical History:  Procedure Laterality Date  . CHOLECYSTECTOMY    . KNEE SURGERY Right    x2  . THYROIDECTOMY      Prior to Admission medications   Medication Sig Start Date End Date Taking? Authorizing Provider  traMADol (ULTRAM) 50 MG tablet Take 1 tablet (50 mg total) by mouth every 6 (six) hours as needed. 09/12/16   Loney Hering, MD    Allergies Codeine; Latex; and Sulfa antibiotics  No family history on file.  Social History Social History  Substance Use Topics  . Smoking status: Current Every Day Smoker  . Smokeless tobacco: Never Used  . Alcohol use No    Review of Systems  Constitutional: No fever/chills Eyes: No visual changes. ENT: No sore throat. Cardiovascular: Denies chest pain. Respiratory: Denies shortness of breath. Gastrointestinal: No abdominal pain.  No nausea, no vomiting.   No diarrhea.  No constipation. Genitourinary: Negative for dysuria. Musculoskeletal: Right knee pain Skin: Negative for rash. Neurological: Negative for headaches, focal weakness or numbness.   ____________________________________________   PHYSICAL EXAM:  VITAL SIGNS: ED Triage Vitals  Enc Vitals Group     BP 09/11/16 2336 (!) 141/104     Pulse Rate 09/11/16 2336 62     Resp 09/11/16 2336 18     Temp 09/11/16 2336 98.2 F (36.8 C)     Temp Source 09/11/16 2336 Oral     SpO2 09/11/16 2336 100 %     Weight 09/11/16 2336 190 lb (86.2 kg)     Height 09/11/16 2336 5\' 7"  (1.702 m)     Head Circumference --      Peak Flow --      Pain Score 09/11/16 2330 3     Pain Loc --      Pain Edu? --      Excl. in [place]? --     Constitutional: Alert and oriented. Well appearing and in Mild distress. Eyes: Conjunctivae are normal. PERRL. EOMI. Head: Atraumatic. Nose: No congestion/rhinnorhea. Mouth/Throat: Mucous membranes are moist.  Oropharynx non-erythematous. Neck: No cervical spine tenderness to palpation Cardiovascular: Normal rate, regular rhythm. Grossly normal heart sounds.  Good peripheral circulation. Respiratory: Normal respiratory effort.  No retractions. Lungs CTAB. Gastrointestinal: Soft and nontender. No distention. Positive bowel sounds Musculoskeletal: Mild erythema to the right knee with  some tenderness to palpation of the superior lateral the patient has ligamentous laxity but does have pain with some valgus stressing. Neurologic:  Normal speech and language.  Skin:  Skin is warm, dry and intact. Marland Kitchen Psychiatric: Mood and affect are normal.   ____________________________________________   LABS (all labs ordered are listed, but only abnormal results are displayed)  Labs Reviewed - No data to display ____________________________________________  EKG  none ____________________________________________  RADIOLOGY  Right knee  xray ____________________________________________   PROCEDURES  Procedure(s) performed: None  Procedures  Critical Care performed: No  ____________________________________________   INITIAL IMPRESSION / ASSESSMENT AND PLAN / ED COURSE  Pertinent labs & imaging results that were available during my care of the patient were reviewed by me and considered in my medical decision making (see chart for details).  This is a 41 year old female who comes into the hospital today with some right knee pain. The patient fell. Her x-ray does not show any fractures that she likely has ligamentous strain or injury. I will place the patient immobilizer just. I will have the patient follow-up with her orthopedic surgeon Dr. Theda Sers in [place]. The patient has no further concerns. Her pain is controlled. She'll be discharged home.  Clinical Course as of Sep 12 149  Tue Sep 12, 2016  0053 No acute osseous abnormality about the knee. DG Knee Complete 4 Views Right [AW]    Clinical Course User Index [AW] Loney Hering, MD     ____________________________________________   FINAL CLINICAL IMPRESSION(S) / ED DIAGNOSES  Final diagnoses:  Fall, initial encounter  Acute pain of right knee      NEW MEDICATIONS STARTED DURING THIS VISIT:  New Prescriptions   TRAMADOL (ULTRAM) 50 MG TABLET    Take 1 tablet (50 mg total) by mouth every 6 (six) hours as needed.     Note:  This document was prepared using Dragon voice recognition software and may include unintentional dictation errors.    Loney Hering, MD 09/12/16 [phone]

## 2016-09-13 DIAGNOSIS — E039 Hypothyroidism, unspecified: Secondary | ICD-10-CM | POA: Insufficient documentation

## 2016-09-18 ENCOUNTER — Ambulatory Visit: Payer: Self-pay | Admitting: Medical

## 2016-09-18 ENCOUNTER — Encounter: Payer: Self-pay | Admitting: Medical

## 2016-09-18 VITALS — BP 128/90 | HR 70 | Temp 97.5°F | Resp 16

## 2016-09-18 DIAGNOSIS — Z026 Encounter for examination for insurance purposes: Secondary | ICD-10-CM

## 2016-09-18 DIAGNOSIS — M25561 Pain in right knee: Secondary | ICD-10-CM

## 2016-09-18 NOTE — Progress Notes (Signed)
   Subjective:    Patient ID: Penny [name], female    DOB: 1975/05/26, 41 y.o.   MRN: [id number]  HPI 41 yo female returns to clinic per my request to see how she is doing with her pain.Original injury on 09/12/2016 seen at the Emergency Department at [facility]. She is  workers Health and safety inspector case.  She states her pain is under control wit taking Ibuprofen regularly , and if she is standing a lot she sometimes needs Tylenol for break through pain. She currently is not taking any tramadol.  Ice and elevation make it feel better. She is using her crutches for support.She was see by Dr. Theda Sers PA and told the hardware in her need looks in place per x-ray , but he is worried about her meniscus. She is pending an MRI of the right knee and awaiting appointment time.   Review of Systems  See HPI    Objective:   Physical Exam Seems in good spirits today. Wearing orthopedic brace. Using crutches for support.      Assessment & Plan:  Right knee pain. Pending MRI through her orhtopedic doctor  ( Dr Theda Sers). Wilburta did have some questions about her disability, I referred her to [employer] for answers to her questions. Return to the clinic as needed.

## 2016-09-20 ENCOUNTER — Other Ambulatory Visit: Payer: Self-pay | Admitting: Orthopedic Surgery

## 2016-09-20 DIAGNOSIS — M25561 Pain in right knee: Secondary | ICD-10-CM

## 2016-09-26 ENCOUNTER — Ambulatory Visit
Admission: RE | Admit: 2016-09-26 | Discharge: 2016-09-26 | Disposition: A | Discharge status: Home / Self Care | Payer: Worker's Compensation | Source: Ambulatory Visit | Attending: Orthopedic Surgery | Admitting: Orthopedic Surgery

## 2016-09-26 DIAGNOSIS — M25561 Pain in right knee: Secondary | ICD-10-CM

## 2016-09-29 ENCOUNTER — Other Ambulatory Visit: Payer: Self-pay

## 2016-10-23 ENCOUNTER — Ambulatory Visit: Payer: Self-pay | Admitting: Orthopedic Surgery

## 2016-10-31 ENCOUNTER — Encounter (HOSPITAL_BASED_OUTPATIENT_CLINIC_OR_DEPARTMENT_OTHER): Payer: Self-pay | Admitting: *Deleted

## 2016-10-31 ENCOUNTER — Other Ambulatory Visit: Payer: Self-pay

## 2016-10-31 DIAGNOSIS — E559 Vitamin D deficiency, unspecified: Secondary | ICD-10-CM

## 2016-10-31 DIAGNOSIS — E781 Pure hyperglyceridemia: Secondary | ICD-10-CM

## 2016-10-31 DIAGNOSIS — E890 Postprocedural hypothyroidism: Secondary | ICD-10-CM

## 2016-10-31 NOTE — Progress Notes (Signed)
To [facility] at 1215-Hg,urine pregnancy on arrival-Npo solids after Mn-no smoking also-clear liquids(no pulp,dairy)untill 0800,then Npo.

## 2016-11-01 LAB — THYROID PANEL WITH TSH
Free Thyroxine Index: 3.2 (ref 1.2–4.9)
T3 Uptake Ratio: 30 % (ref 24–39)
T4 TOTAL: 10.8 ug/dL (ref 4.5–12.0)
TSH: 0.102 u[IU]/mL — ABNORMAL LOW (ref 0.450–4.500)

## 2016-11-01 LAB — VITAMIN D 25 HYDROXY (VIT D DEFICIENCY, FRACTURES): Vit D, 25-Hydroxy: 26.8 ng/mL — ABNORMAL LOW (ref 30.0–100.0)

## 2016-11-01 LAB — LIPID PANEL
CHOLESTEROL TOTAL: 145 mg/dL (ref 100–199)
Chol/HDL Ratio: 5.4 ratio — ABNORMAL HIGH (ref 0.0–4.4)
HDL: 27 mg/dL — ABNORMAL LOW (ref >39)
LDL CALC: 55 mg/dL (ref 0–99)
Triglycerides: 317 mg/dL — ABNORMAL HIGH (ref 0–149)
VLDL CHOLESTEROL CAL: 63 mg/dL — AB (ref 5–40)

## 2016-11-09 ENCOUNTER — Ambulatory Visit (HOSPITAL_BASED_OUTPATIENT_CLINIC_OR_DEPARTMENT_OTHER)
Admission: RE | Admit: 2016-11-09 | Discharge: 2016-11-09 | Disposition: A | Discharge status: Home / Self Care | Payer: Worker's Compensation | Source: Ambulatory Visit | Attending: Specialist | Admitting: Specialist

## 2016-11-09 ENCOUNTER — Ambulatory Visit (HOSPITAL_BASED_OUTPATIENT_CLINIC_OR_DEPARTMENT_OTHER): Payer: Worker's Compensation | Admitting: Anesthesiology

## 2016-11-09 ENCOUNTER — Ambulatory Visit (HOSPITAL_COMMUNITY): Payer: Worker's Compensation

## 2016-11-09 ENCOUNTER — Encounter (HOSPITAL_BASED_OUTPATIENT_CLINIC_OR_DEPARTMENT_OTHER)
Admission: RE | Disposition: A | Discharge status: Home / Self Care | Payer: Self-pay | Source: Ambulatory Visit | Attending: Specialist

## 2016-11-09 ENCOUNTER — Encounter (HOSPITAL_BASED_OUTPATIENT_CLINIC_OR_DEPARTMENT_OTHER): Payer: Self-pay | Admitting: *Deleted

## 2016-11-09 DIAGNOSIS — Z882 Allergy status to sulfonamides status: Secondary | ICD-10-CM | POA: Insufficient documentation

## 2016-11-09 DIAGNOSIS — S83281A Other tear of lateral meniscus, current injury, right knee, initial encounter: Secondary | ICD-10-CM | POA: Diagnosis present

## 2016-11-09 DIAGNOSIS — Z91040 Latex allergy status: Secondary | ICD-10-CM | POA: Insufficient documentation

## 2016-11-09 DIAGNOSIS — Z9889 Other specified postprocedural states: Secondary | ICD-10-CM

## 2016-11-09 DIAGNOSIS — F17200 Nicotine dependence, unspecified, uncomplicated: Secondary | ICD-10-CM | POA: Diagnosis not present

## 2016-11-09 DIAGNOSIS — S82121A Displaced fracture of lateral condyle of right tibia, initial encounter for closed fracture: Secondary | ICD-10-CM | POA: Insufficient documentation

## 2016-11-09 DIAGNOSIS — S82141A Displaced bicondylar fracture of right tibia, initial encounter for closed fracture: Secondary | ICD-10-CM | POA: Diagnosis present

## 2016-11-09 DIAGNOSIS — X58XXXA Exposure to other specified factors, initial encounter: Secondary | ICD-10-CM | POA: Diagnosis not present

## 2016-11-09 DIAGNOSIS — E039 Hypothyroidism, unspecified: Secondary | ICD-10-CM | POA: Diagnosis not present

## 2016-11-09 DIAGNOSIS — Z79899 Other long term (current) drug therapy: Secondary | ICD-10-CM | POA: Diagnosis not present

## 2016-11-09 DIAGNOSIS — Z885 Allergy status to narcotic agent status: Secondary | ICD-10-CM | POA: Insufficient documentation

## 2016-11-09 DIAGNOSIS — M238X1 Other internal derangements of right knee: Secondary | ICD-10-CM | POA: Insufficient documentation

## 2016-11-09 HISTORY — DX: Adverse effect of unspecified anesthetic, initial encounter: T41.45XA

## 2016-11-09 HISTORY — DX: Other complications of anesthesia, initial encounter: T88.59XA

## 2016-11-09 HISTORY — PX: CHONDROPLASTY: SHX5177

## 2016-11-09 HISTORY — DX: Hypothyroidism, unspecified: E03.9

## 2016-11-09 LAB — POCT HEMOGLOBIN-HEMACUE: Hemoglobin: 15.4 g/dL — ABNORMAL HIGH (ref 12.0–15.0)

## 2016-11-09 LAB — POCT PREGNANCY, URINE: PREG TEST UR: NEGATIVE

## 2016-11-09 SURGERY — CHONDROPLASTY
Anesthesia: Regional | Site: Knee | Laterality: Right

## 2016-11-09 MED ORDER — ONDANSETRON HCL 4 MG/2ML IJ SOLN
INTRAMUSCULAR | Status: AC
  Filled 2016-11-09: qty 2

## 2016-11-09 MED ORDER — MIDAZOLAM HCL 2 MG/2ML IJ SOLN
INTRAMUSCULAR | Status: AC
  Filled 2016-11-09: qty 2

## 2016-11-09 MED ORDER — DEXAMETHASONE SODIUM PHOSPHATE 10 MG/ML IJ SOLN
INTRAMUSCULAR | Status: AC
  Filled 2016-11-09: qty 1

## 2016-11-09 MED ORDER — OXYCODONE HCL 5 MG PO TABS
5.0000 mg | ORAL_TABLET | Freq: Once | ORAL | Status: DC | PRN
  Filled 2016-11-09: qty 1

## 2016-11-09 MED ORDER — BUPIVACAINE HCL 0.25 % IJ SOLN
INTRAMUSCULAR | Status: DC | PRN
  Administered 2016-11-09: 30 mL

## 2016-11-09 MED ORDER — ONDANSETRON HCL 4 MG/2ML IJ SOLN
INTRAMUSCULAR | Status: DC | PRN
  Administered 2016-11-09: 4 mg via INTRAVENOUS

## 2016-11-09 MED ORDER — LIDOCAINE HCL (CARDIAC) 20 MG/ML IV SOLN
INTRAVENOUS | Status: DC | PRN
  Administered 2016-11-09: 80 mg via INTRAVENOUS

## 2016-11-09 MED ORDER — BUPIVACAINE HCL (PF) 0.25 % IJ SOLN
INTRAMUSCULAR | Status: AC
  Filled 2016-11-09: qty 30

## 2016-11-09 MED ORDER — DEXAMETHASONE SODIUM PHOSPHATE 4 MG/ML IJ SOLN
INTRAMUSCULAR | Status: DC | PRN
  Administered 2016-11-09: 10 mg via INTRAVENOUS

## 2016-11-09 MED ORDER — HYDROMORPHONE HCL 2 MG PO TABS
2.0000 mg | ORAL_TABLET | Freq: Once | ORAL | Status: AC
Start: 2016-11-09 — End: 2016-11-09
  Administered 2016-11-09: 2 mg via ORAL
  Filled 2016-11-09: qty 1

## 2016-11-09 MED ORDER — HYDROMORPHONE HCL 2 MG PO TABS
ORAL_TABLET | ORAL | Status: AC
  Filled 2016-11-09: qty 1

## 2016-11-09 MED ORDER — FENTANYL CITRATE (PF) 100 MCG/2ML IJ SOLN
INTRAMUSCULAR | Status: AC
  Filled 2016-11-09: qty 2

## 2016-11-09 MED ORDER — CEFAZOLIN SODIUM-DEXTROSE 2-4 GM/100ML-% IV SOLN
INTRAVENOUS | Status: AC
  Filled 2016-11-09: qty 100

## 2016-11-09 MED ORDER — MORPHINE SULFATE (PF) 4 MG/ML IV SOLN
INTRAVENOUS | Status: AC
  Filled 2016-11-09: qty 1

## 2016-11-09 MED ORDER — SODIUM CHLORIDE 0.9 % IR SOLN
Status: DC | PRN
  Administered 2016-11-09: 7000 mL

## 2016-11-09 MED ORDER — PROPOFOL 10 MG/ML IV BOLUS
INTRAVENOUS | Status: DC | PRN
  Administered 2016-11-09: 200 mg via INTRAVENOUS

## 2016-11-09 MED ORDER — HYDROMORPHONE HCL 1 MG/ML IJ SOLN
INTRAMUSCULAR | Status: AC
  Filled 2016-11-09: qty 0.5

## 2016-11-09 MED ORDER — PROPOFOL 10 MG/ML IV BOLUS
INTRAVENOUS | Status: AC
  Filled 2016-11-09: qty 20

## 2016-11-09 MED ORDER — PROMETHAZINE HCL 12.5 MG PO TABS: 12.5000 mg | ORAL_TABLET | Freq: Three times a day (TID) | ORAL | 0 refills | Status: DC | PRN

## 2016-11-09 MED ORDER — ASPIRIN EC 325 MG PO TBEC: 325.0000 mg | DELAYED_RELEASE_TABLET | Freq: Two times a day (BID) | ORAL | 3 refills | Status: AC

## 2016-11-09 MED ORDER — MIDAZOLAM HCL 5 MG/5ML IJ SOLN
INTRAMUSCULAR | Status: DC | PRN
  Administered 2016-11-09: 2 mg via INTRAVENOUS

## 2016-11-09 MED ORDER — LACTATED RINGERS IV SOLN
INTRAVENOUS | Status: DC
  Administered 2016-11-09 (×2): via INTRAVENOUS
  Filled 2016-11-09: qty 1000

## 2016-11-09 MED ORDER — CHLORHEXIDINE GLUCONATE 4 % EX LIQD
60.0000 mL | Freq: Once | CUTANEOUS | Status: DC
  Filled 2016-11-09: qty 118

## 2016-11-09 MED ORDER — BUPIVACAINE-EPINEPHRINE (PF) 0.5% -1:200000 IJ SOLN
INTRAMUSCULAR | Status: DC | PRN
  Administered 2016-11-09: 30 mL via PERINEURAL

## 2016-11-09 MED ORDER — PROMETHAZINE HCL 25 MG/ML IJ SOLN
6.2500 mg | INTRAMUSCULAR | Status: DC | PRN
  Filled 2016-11-09: qty 1

## 2016-11-09 MED ORDER — FENTANYL CITRATE (PF) 100 MCG/2ML IJ SOLN
INTRAMUSCULAR | Status: DC | PRN
  Administered 2016-11-09 (×8): 25 ug via INTRAVENOUS

## 2016-11-09 MED ORDER — HYDROMORPHONE HCL 2 MG PO TABS: 2.0000 mg | ORAL_TABLET | ORAL | 0 refills | Status: DC | PRN

## 2016-11-09 MED ORDER — OXYCODONE HCL 5 MG PO TABS
ORAL_TABLET | ORAL | Status: AC
  Filled 2016-11-09: qty 1

## 2016-11-09 MED ORDER — HYDROMORPHONE HCL 1 MG/ML IJ SOLN
0.2500 mg | INTRAMUSCULAR | Status: DC | PRN
  Administered 2016-11-09: 0.5 mg via INTRAVENOUS
  Filled 2016-11-09: qty 0.5

## 2016-11-09 MED ORDER — LIDOCAINE 2% (20 MG/ML) 5 ML SYRINGE
INTRAMUSCULAR | Status: AC
  Filled 2016-11-09: qty 5

## 2016-11-09 MED ORDER — MIDAZOLAM HCL 2 MG/2ML IJ SOLN
2.0000 mg | Freq: Once | INTRAMUSCULAR | Status: AC
  Administered 2016-11-09: 2 mg via INTRAVENOUS
  Filled 2016-11-09: qty 2

## 2016-11-09 MED ORDER — OXYCODONE HCL 5 MG/5ML PO SOLN
5.0000 mg | Freq: Once | ORAL | Status: DC | PRN
  Filled 2016-11-09: qty 5

## 2016-11-09 MED ORDER — CEFAZOLIN SODIUM-DEXTROSE 2-4 GM/100ML-% IV SOLN
2.0000 g | INTRAVENOUS | Status: AC
  Administered 2016-11-09: 2 g via INTRAVENOUS
  Filled 2016-11-09: qty 100

## 2016-11-09 MED ORDER — CEPHALEXIN 500 MG PO CAPS: 500.0000 mg | ORAL_CAPSULE | Freq: Three times a day (TID) | ORAL | 0 refills | Status: DC

## 2016-11-09 SURGICAL SUPPLY — 52 items
BANDAGE ESMARK 6X9 LF (GAUZE/BANDAGES/DRESSINGS) ×2 IMPLANT
BLADE CUDA GRT WHITE 3.5 (BLADE) ×3 IMPLANT
BNDG CMPR 9X6 STRL LF SNTH (GAUZE/BANDAGES/DRESSINGS) ×2
BNDG ESMARK 6X9 LF (GAUZE/BANDAGES/DRESSINGS) ×3
BNDG GAUZE ELAST 4 BULKY (GAUZE/BANDAGES/DRESSINGS) ×3 IMPLANT
CANISTER SUCTION 1200CC (MISCELLANEOUS) ×3 IMPLANT
CUFF TOURNIQUET SINGLE 34IN LL (TOURNIQUET CUFF) ×3 IMPLANT
DRAPE ARTHROSCOPY W/POUCH 114 (DRAPES) ×3 IMPLANT
DRAPE C-ARM 42X72 X-RAY (DRAPES) ×3 IMPLANT
DRAPE INCISE IOBAN 66X45 STRL (DRAPES) ×3 IMPLANT
DRAPE LG THREE QUARTER DISP (DRAPES) ×3 IMPLANT
DRSG PAD ABDOMINAL 8X10 ST (GAUZE/BANDAGES/DRESSINGS) ×2 IMPLANT
DURAPREP 26ML APPLICATOR (WOUND CARE) ×3 IMPLANT
ELECT MENISCUS 165MM 90D (ELECTRODE) IMPLANT
ELECT REM PT RETURN 9FT ADLT (ELECTROSURGICAL)
ELECTRODE REM PT RTRN 9FT ADLT (ELECTROSURGICAL) IMPLANT
GAUZE SPONGE 4X4 12PLY STRL LF (GAUZE/BANDAGES/DRESSINGS) ×3 IMPLANT
GAUZE SPONGE 4X4 8PLY STR LF (GAUZE/BANDAGES/DRESSINGS) ×2 IMPLANT
GAUZE XEROFORM 1X8 LF (GAUZE/BANDAGES/DRESSINGS) ×3 IMPLANT
GLOVE BIO SURGEON STRL SZ7.5 (GLOVE) ×3 IMPLANT
GLOVE BIO SURGEON STRL SZ8 (GLOVE) ×3 IMPLANT
GLOVE INDICATOR 8.0 STRL GRN (GLOVE) ×6 IMPLANT
GOWN STRL REUS W/TWL LRG LVL3 (GOWN DISPOSABLE) ×3 IMPLANT
GOWN STRL REUS W/TWL XL LVL3 (GOWN DISPOSABLE) ×6 IMPLANT
IMMOBILIZER KNEE 22 UNIV (SOFTGOODS) IMPLANT
IMMOBILIZER KNEE 24 THIGH 36 (MISCELLANEOUS) IMPLANT
IMMOBILIZER KNEE 24 UNIV (MISCELLANEOUS)
IV NS IRRIG 3000ML ARTHROMATIC (IV SOLUTION) ×8 IMPLANT
KIT KNEE SUBCHONDROPLASTY (Joint) ×3 IMPLANT
KIT MIXER ACCUMIX (KITS) ×2 IMPLANT
KIT RM TURNOVER CYSTO AR (KITS) ×3 IMPLANT
KNEE WRAP E Z 3 GEL PACK (MISCELLANEOUS) ×3 IMPLANT
MANIFOLD NEPTUNE II (INSTRUMENTS) ×3 IMPLANT
NEEDLE HYPO 22GX1.5 SAFETY (NEEDLE) IMPLANT
PACK ARTHROSCOPY DSU (CUSTOM PROCEDURE TRAY) ×3 IMPLANT
PACK BASIN DAY SURGERY FS (CUSTOM PROCEDURE TRAY) ×3 IMPLANT
PAD ABD 8X10 STRL (GAUZE/BANDAGES/DRESSINGS) ×3 IMPLANT
PAD ARMBOARD 7.5X6 YLW CONV (MISCELLANEOUS) IMPLANT
PAD CAST 4YDX4 CTTN HI CHSV (CAST SUPPLIES) ×1 IMPLANT
PADDING CAST COTTON 4X4 STRL (CAST SUPPLIES) ×3
PENCIL BUTTON HOLSTER BLD 10FT (ELECTRODE) IMPLANT
PROBE BIPOLAR 50 DEGREE SUCT (MISCELLANEOUS) ×2 IMPLANT
PROBE BIPOLAR ATHRO 135MM 90D (MISCELLANEOUS) IMPLANT
SET ARTHROSCOPY TUBING (MISCELLANEOUS) ×3
SET ARTHROSCOPY TUBING LN (MISCELLANEOUS) ×2 IMPLANT
SPONGE GAUZE 4X4 12PLY (GAUZE/BANDAGES/DRESSINGS) ×3 IMPLANT
SUT ETHILON 4 0 PS 2 18 (SUTURE) ×3 IMPLANT
SYR CONTROL 10ML LL (SYRINGE) ×3 IMPLANT
TOWEL OR 17X24 6PK STRL BLUE (TOWEL DISPOSABLE) ×3 IMPLANT
TUBE CONNECTING 12X1/4 (SUCTIONS) IMPLANT
WAND 30 DEG SABER W/CORD (SURGICAL WAND) IMPLANT
WATER STERILE IRR 500ML POUR (IV SOLUTION) ×3 IMPLANT

## 2016-11-09 NOTE — Op Note (Deleted)
  The note originally documented on this encounter has been moved the the encounter in which it belongs.  

## 2016-11-09 NOTE — H&P (View-Only) (Signed)
To [facility] at 1215-Hg,urine pregnancy on arrival-Npo solids after Mn-no smoking also-clear liquids(no pulp,dairy)untill 0800,then Npo.

## 2016-11-09 NOTE — Op Note (Signed)
[phone]

## 2016-11-09 NOTE — Transfer of Care (Signed)
Immediate Anesthesia Transfer of Care Note  Patient: Penny [name]  Procedure(s) Performed: Procedure(s) (LRB): RIGHT KNEE ARTHROSCOPY, PARTIAL MEDIAL MENISCECOMTY, LATERAL TIBIAL PLATEAU SUBCHONDROPLASTY (Right)  Patient Location: PACU  Anesthesia Type: General  Level of Consciousness: awake, sedated, patient cooperative and responds to stimulation  Airway & Oxygen Therapy: Patient Spontanous Breathing and Patient connected to  O2  Post-op Assessment: Report given to PACU RN, Post -op Vital signs reviewed and stable and Patient moving all extremities  Post vital signs: Reviewed and stable  Complications: No apparent anesthesia complications

## 2016-11-09 NOTE — Anesthesia Preprocedure Evaluation (Addendum)
Anesthesia Evaluation  Patient identified by MRN, date of birth, ID band Patient awake    Reviewed: Allergy & Precautions, NPO status , Patient's Chart, lab work & pertinent test results  Airway Mallampati: II  TM Distance: >3 FB Neck ROM: Full    Dental no notable dental hx.    Pulmonary Current Smoker (1/2 ppd),    Pulmonary exam normal breath sounds clear to auscultation       Cardiovascular negative cardio ROS Normal cardiovascular exam Rhythm:Regular Rate:Normal     Neuro/Psych negative neurological ROS  negative psych ROS   GI/Hepatic negative GI ROS, Neg liver ROS,   Endo/Other  Hypothyroidism   Renal/GU negative Renal ROS  negative genitourinary   Musculoskeletal negative musculoskeletal ROS (+)   Abdominal   Peds negative pediatric ROS (+)  Hematology negative hematology ROS (+)   Anesthesia Other Findings   Reproductive/Obstetrics negative OB ROS                            Anesthesia Physical Anesthesia Plan  ASA: II  Anesthesia Plan: General and Regional   Post-op Pain Management:  Regional for Post-op pain   Induction: Intravenous  PONV Risk Score and Plan: 2 and Ondansetron, Dexamethasone, Propofol and Midazolam  Airway Management Planned: LMA  Additional Equipment:   Intra-op Plan:   Post-operative Plan:   Informed Consent: I have reviewed the patients History and Physical, chart, labs and discussed the procedure including the risks, benefits and alternatives for the proposed anesthesia with the patient or authorized representative who has indicated his/her understanding and acceptance.   Dental advisory given  Plan Discussed with: CRNA  Anesthesia Plan Comments:         Anesthesia Quick Evaluation

## 2016-11-09 NOTE — Op Note (Signed)
[name], [name] NO.:  [id number]  MEDICAL RECORD NO.:  [id number]  LOCATION:                                 FACILITY:  PHYSICIAN:  Cynda Familia, M.D.DATE OF BIRTH:  [birth year]  DATE OF PROCEDURE:  11/09/2016 DATE OF DISCHARGE:                              OPERATIVE REPORT   PREOPERATIVE DIAGNOSES: 1. Right knee torn lateral meniscus. 2. Right knee lateral tibial plateau insufficiency fracture.  POSTOPERATIVE DIAGNOSES: 1. Right knee torn lateral meniscus. 2. Right knee lateral tibial plateau insufficiency fracture. 3. Anterior cruciate ligament laxity.  PROCEDURES: 1. Right knee arthroscopic-assisted partial lateral meniscectomy. 2. Arthroscopic-assisted internal fixation of lateral tibial plateau     insufficiency fracture.  SURGEON:  Cynda Familia, M.D.  ASSISTANT:  Wyatt Portela, PA-C.  ANESTHESIA:  General with adductor canal block, knee block.  ESTIMATED BLOOD LOSS:  Minimal.  DRAINS:  None.  COMPLICATIONS:  None.  TOURNIQUET TIME:  40 minutes at 300 mmHg.  DISPOSITION:  PACU, stable.  OPERATIVE DETAILS:  The patient and family counseled in the holding area.  Correct site was identified.  IV was started.  Sterile skin block was administered.  IV antibiotics were given, taken to the operating room, placed in supine position under general anesthesia.  TED hose had been applied to the uninvolved leg.  Right lower extremity was elevated, it was prepped with DuraPrep and draped in sterile fashion.  The patient was found to have quite a bit of recurvatum, lower extremity was protected throughout entire time.  After time-out was done, exsanguinated with Esmarch, tourniquet was inflated to 300 mmHg. Arthroscopic portals were established, proximal medial, inferomedial, inferolateral and inferoproximal.  At this point in time, patellofemoral joint with normal articular cartilage and tracking.  ACL graft markedly vertical  and lax, PCL was intact.  Medial side was inspected, articular cartilage was normal as was the medial meniscus.  Lateral side was inspected, articular cartilage was healthy, but there was a large complex tear basically involving to vast majority of the entire lateral meniscus utilizing basket, cautery system and shaver.  Partial lateral meniscectomy was performed, back to stable base, properly beveled and contoured.  There was no compromise of articular surface by the about stress fracture, intra-articular visible at this time.  Arthroscopic equipment was removed.  After the partial lateral meniscectomy, the C-arm was brought in.  We localized the lesion, AP and lateral planes, chose a Biomet Zimmer subchondroplasty kit with the trocar into the bone, insufficiency fracture.  Confirmed AP and lateral planes, being very cautious with the common peroneal nerve and neurovascular structures.  Following this, we utilized the C-arm.  We infused AccuFill into the varus lesion, was pressurized nicely, but not over-pressurized.  During this time, there was an increase in her blood pressure corresponding with the introduction of the AccuFill.  We then reinspected the knee joint arthroscopically, made sure there was no AccuFill in the joint, there was not.  Irrigated, and arthroscopic equipment was removed.  After the x-rays setup, the trocar was removed. We confirmed postoperatively that there was no extravasation of AccuFill and we have a nice blush in the defect area.  At  this point in time, everything was closed nicely, 10 mL of Sensorcaine was placed into the skin and into the knee joint.  Sterile compressive dressing applied along with TED hose and ice pack.  There were no complications.  She was awakened and tourniquet deflated after dressing applied.  There were no complications or problems.  She was awakened, taken from the operating room to the PACU in stable condition.  To help with the  patient positioning, prepping, draping, technical and surgical assistance throughout the entire case, raising C-arm images, wound closure, application of dressing and so forth, Mr. Wyatt Portela PA-C's assistance was needed throughout the entire case.    ______________________________ Cynda Familia, M.D.   ______________________________ Cynda Familia, M.D.    RAC/MEDQ  D:  11/09/2016  T:  11/09/2016  Job:  [id number]

## 2016-11-09 NOTE — Discharge Instructions (Signed)

## 2016-11-09 NOTE — Interval H&P Note (Signed)
History and Physical Interval Note:  1/90/1222 4:11 PM  Penny [name]  has presented today for surgery, with the diagnosis of Right knee torn lateral meniscus, lateral tibial plateau bone edema  The various methods of treatment have been discussed with the patient and family. After consideration of risks, benefits and other options for treatment, the patient has consented to  Procedure(s): RIGHT KNEE ARTHROSCOPY, PARTIAL MEDIAL MENISCECOMTY, LATERAL TIBIAL PLATEAU SUBCHONDROPLASTY (Right) as a surgical intervention .  The patient's history has been reviewed, patient examined, no change in status, stable for surgery.  I have reviewed the patient's chart and labs.  Questions were answered to the patient's satisfaction.     Ariz Terrones ANDREW

## 2016-11-09 NOTE — Anesthesia Procedure Notes (Signed)
Procedure Name: LMA Insertion Date/Time: 11/09/2016 3:54 PM Performed by: Justice Rocher Pre-anesthesia Checklist: Patient identified, Emergency Drugs available, Suction available and Patient being monitored Patient Re-evaluated:Patient Re-evaluated prior to inductionOxygen Delivery Method: Circle system utilized Preoxygenation: Pre-oxygenation with 100% oxygen Intubation Type: IV induction Ventilation: Mask ventilation without difficulty LMA: LMA inserted LMA Size: 4.0 Number of attempts: 1 Airway Equipment and Method: Bite block Placement Confirmation: positive ETCO2 and breath sounds checked- equal and bilateral Tube secured with: Tape Dental Injury: Teeth and Oropharynx as per pre-operative assessment

## 2016-11-09 NOTE — H&P (Signed)
Penny [name] is an 41 y.o. female.   Chief Complaint: right knee pain HPI: Patient presents with joint discomfort that had been persistent for several weeks now. Despite conservative treatments, the patients discomfort has not improved. Imaging was obtained. Other conservative and surgical treatments were discussed in detail. Patient wishes to proceed with surgery as consented. Denies SOB, CP, or calf pain. No Fever, chills, or nausea/ vomiting.   Past Medical History:  Diagnosis Date  . Complication of anesthesia    felt sedation lasted several days in '90's w/knee surgery  . Hypothyroidism     Past Surgical History:  Procedure Laterality Date  . CHOLECYSTECTOMY  2001  . KNEE SURGERY Right 1700,1749   meniscus repair first-ACL repair in '98  . THYROIDECTOMY  2011    History reviewed. No pertinent family history. Social History:  reports that she has been smoking.  She has been smoking about 0.50 packs per day. She has never used smokeless tobacco. She reports that she does not drink alcohol. Her drug history is not on file.  Allergies:  Allergies  Allergen Reactions  . Codeine Shortness Of Breath    Nausea,dizziness when took a dose by mistake-no breathing problem at that time  . Latex Rash  . Sulfa Antibiotics Rash    Medications Prior to Admission  Medication Sig Dispense Refill  . acetaminophen (TYLENOL) 500 MG tablet Take 500 mg by mouth daily.    Marland Kitchen ibuprofen (ADVIL,MOTRIN) 600 MG tablet Take 600 mg by mouth every 6 (six) hours as needed.    Marland Kitchen levothyroxine (SYNTHROID, LEVOTHROID) 300 MCG tablet Take 300 mcg by mouth daily.    . traMADol (ULTRAM) 50 MG tablet Take 1 tablet (50 mg total) by mouth every 6 (six) hours as needed. 12 tablet 0    Results for orders placed or performed during the hospital encounter of 11/09/16 (from the past 48 hour(s))  Pregnancy, urine POC     Status: None   Collection Time: 11/09/16 12:54 PM  Result Value Ref Range   Preg Test, Ur  NEGATIVE NEGATIVE    Comment:        THE SENSITIVITY OF THIS METHODOLOGY IS >24 mIU/mL   Hemoglobin-hemacue, POC     Status: Abnormal   Collection Time: 11/09/16  1:12 PM  Result Value Ref Range   Hemoglobin 15.4 (H) 12.0 - 15.0 g/dL   No results found.  Review of Systems  Constitutional: Negative.   HENT: Negative.   Eyes: Negative.   Cardiovascular: Negative.   Gastrointestinal: Negative.   Genitourinary: Negative.   Musculoskeletal: Positive for joint pain.  Skin: Negative.   Neurological: Negative.   Endo/Heme/Allergies: Negative.   Psychiatric/Behavioral: Negative.     Blood pressure (P) 126/69, pulse 84, temperature 97.9 F (36.6 C), temperature source Oral, resp. rate 16, height 5\' 7"  (1.702 m), weight 87.3 kg (192 lb 8 oz), last menstrual period 10/11/2016, SpO2 97 %. Physical Exam  Constitutional: She is oriented to person, place, and time. She appears well-developed.  HENT:  Head: Normocephalic.  Eyes: EOM are normal.  Neck: Normal range of motion.  Cardiovascular: Normal rate and intact distal pulses.   Respiratory: Effort normal.  GI: Soft.  Genitourinary:  Genitourinary Comments: Deferred  Musculoskeletal:  Right knee pain with lateral palpation. RLE grossly n/v intact.  Neurological: She is alert and oriented to person, place, and time.  Skin: Skin is warm and dry.  Psychiatric: Her behavior is normal.     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

## 2016-11-09 NOTE — Anesthesia Procedure Notes (Signed)
Anesthesia Regional Block: Adductor canal block   Pre-Anesthetic Checklist: ,, timeout performed, Correct Patient, Correct Site, Correct Laterality, Correct Procedure,, site marked, risks and benefits discussed, Surgical consent,  Pre-op evaluation,  At surgeon's request and post-op pain management  Laterality: Right  Prep: chloraprep       Needles:  Injection technique: Single-shot  Needle Type: Stimiplex     Needle Length: 10cm  Needle Gauge: 21     Additional Needles:   Procedures: ultrasound guided,,,,,,,,  Narrative:  Start time: 11/09/2016 1:25 PM End time: 11/09/2016 1:35 PM Injection made incrementally with aspirations every 5 mL.  Performed by: Personally  Anesthesiologist: Adele Barthel P  Additional Notes: Functioning IV was confirmed and monitors were applied.  A 80mm 21ga Arrow echogenic stimulator needle was used. Sterile prep and drape,hand hygiene and sterile gloves were used.  Negative aspiration and negative test dose prior to incremental administration of local anesthetic. The patient tolerated the procedure well.

## 2016-11-13 NOTE — Anesthesia Postprocedure Evaluation (Signed)
Anesthesia Post Note  Patient: Penny [name]  Procedure(s) Performed: Procedure(s) (LRB): RIGHT KNEE ARTHROSCOPY, PARTIAL MEDIAL MENISCECOMTY, LATERAL TIBIAL PLATEAU SUBCHONDROPLASTY (Right)     Anesthesia Type: General    Last Vitals:  Vitals:   11/09/16 1815 11/09/16 1900  BP: (!) 150/88 135/80  Pulse: 65 63  Resp: 12 12  Temp:  36.8 C    Last Pain:  Vitals:   11/10/16 1519  TempSrc:   PainSc: [place]

## 2016-11-14 ENCOUNTER — Encounter (HOSPITAL_BASED_OUTPATIENT_CLINIC_OR_DEPARTMENT_OTHER): Payer: Self-pay | Admitting: Specialist

## 2017-01-24 ENCOUNTER — Encounter: Payer: Self-pay | Admitting: Medical

## 2017-01-24 ENCOUNTER — Ambulatory Visit: Payer: Self-pay | Admitting: Medical

## 2017-01-24 VITALS — BP 122/80 | HR 79 | Temp 98.0°F | Resp 18 | Ht 67.0 in | Wt 187.0 lb

## 2017-01-24 DIAGNOSIS — R062 Wheezing: Secondary | ICD-10-CM

## 2017-01-24 DIAGNOSIS — R05 Cough: Secondary | ICD-10-CM

## 2017-01-24 DIAGNOSIS — R059 Cough, unspecified: Secondary | ICD-10-CM

## 2017-01-24 DIAGNOSIS — J40 Bronchitis, not specified as acute or chronic: Secondary | ICD-10-CM

## 2017-01-24 MED ORDER — BENZONATATE 100 MG PO CAPS: 100.0000 mg | ORAL_CAPSULE | Freq: Two times a day (BID) | ORAL | 0 refills | Status: DC | PRN

## 2017-01-24 MED ORDER — ALBUTEROL SULFATE HFA 108 (90 BASE) MCG/ACT IN AERS: 2.0000 | INHALATION_SPRAY | Freq: Four times a day (QID) | RESPIRATORY_TRACT | 0 refills | Status: DC | PRN

## 2017-01-24 MED ORDER — AZITHROMYCIN 250 MG PO TABS: ORAL_TABLET | ORAL | 0 refills | Status: DC

## 2017-01-24 MED ORDER — PREDNISONE 10 MG (21) PO TBPK: ORAL_TABLET | ORAL | 0 refills | Status: DC

## 2017-01-24 NOTE — Patient Instructions (Addendum)
Steps to Quit Smoking Smoking tobacco can be bad for your health. It can also affect almost every organ in your body. Smoking puts you and people around you at risk for many serious long-lasting (chronic) diseases. Quitting smoking is hard, but it is one of the best things that you can do for your health. It is never too late to quit. What are the benefits of quitting smoking? When you quit smoking, you lower your risk for getting serious diseases and conditions. They can include:  Lung cancer or lung disease.  Heart disease.  Stroke.  Heart attack.  Not being able to have children (infertility).  Weak bones (osteoporosis) and broken bones (fractures).  If you have coughing, wheezing, and shortness of breath, those symptoms may get better when you quit. You may also get sick less often. If you are pregnant, quitting smoking can help to lower your chances of having a baby of low birth weight. What can I do to help me quit smoking? Talk with your doctor about what can help you quit smoking. Some things you can do (strategies) include:  Quitting smoking totally, instead of slowly cutting back how much you smoke over a period of time.  Going to in-person counseling. You are more likely to quit if you go to many counseling sessions.  Using resources and support systems, such as: ? Online chats with a counselor. ? Phone quitlines. ? Printed self-help materials. ? Support groups or group counseling. ? Text messaging programs. ? Mobile phone apps or applications.  Taking medicines. Some of these medicines may have nicotine in them. If you are pregnant or breastfeeding, do not take any medicines to quit smoking unless your doctor says it is okay. Talk with your doctor about counseling or other things that can help you.  Talk with your doctor about using more than one strategy at the same time, such as taking medicines while you are also going to in-person counseling. This can help make  quitting easier. What things can I do to make it easier to quit? Quitting smoking might feel very hard at first, but there is a lot that you can do to make it easier. Take these steps:  Talk to your family and friends. Ask them to support and encourage you.  Call phone quitlines, reach out to support groups, or work with a counselor.  Ask people who smoke to not smoke around you.  Avoid places that make you want (trigger) to smoke, such as: ? Bars. ? Parties. ? Smoke-break areas at work.  Spend time with people who do not smoke.  Lower the stress in your life. Stress can make you want to smoke. Try these things to help your stress: ? Getting regular exercise. ? Deep-breathing exercises. ? Yoga. ? Meditating. ? Doing a body scan. To do this, close your eyes, focus on one area of your body at a time from head to toe, and notice which parts of your body are tense. Try to relax the muscles in those areas.  Download or buy apps on your mobile phone or tablet that can help you stick to your quit plan. There are many free apps, such as QuitGuide from the CDC (Centers for Disease Control and Prevention). You can find more support from smokefree.gov and other websites.  This information is not intended to replace advice given to you by your health care provider. Make sure you discuss any questions you have with your health care provider. Document Released: 02/25/2009 Document   Revised: 12/28/2015 Document Reviewed: 09/15/2014 Elsevier Interactive Patient Education  2018 [employer]. Acute Bronchitis, Adult Acute bronchitis is when air tubes (bronchi) in the lungs suddenly get swollen. The condition can make it hard to breathe. It can also cause these symptoms:  A cough.  Coughing up clear, yellow, or green mucus.  Wheezing.  Chest congestion.  Shortness of breath.  A fever.  Body aches.  Chills.  A sore throat.  Follow these instructions at home: Medicines  Take  over-the-counter and prescription medicines only as told by your doctor.  If you were prescribed an antibiotic medicine, take it as told by your doctor. Do not stop taking the antibiotic even if you start to feel better. General instructions  Rest.  Drink enough fluids to keep your pee (urine) clear or pale yellow.  Avoid smoking and secondhand smoke. If you smoke and you need help quitting, ask your doctor. Quitting will help your lungs heal faster.  Use an inhaler, cool mist vaporizer, or humidifier as told by your doctor.  Keep all follow-up visits as told by your doctor. This is important. How is this prevented? To lower your risk of getting this condition again:  Wash your hands often with soap and water. If you cannot use soap and water, use hand sanitizer.  Avoid contact with people who have cold symptoms.  Try not to touch your hands to your mouth, nose, or eyes.  Make sure to get the flu shot every year.  Contact a doctor if:  Your symptoms do not get better in 2 weeks. Get help right away if:  You cough up blood.  You have chest pain.  You have very bad shortness of breath.  You become dehydrated.  You faint (pass out) or keep feeling like you are going to pass out.  You keep throwing up (vomiting).  You have a very bad headache.  Your fever or chills gets worse. This information is not intended to replace advice given to you by your health care provider. Make sure you discuss any questions you have with your health care provider. Document Released: 10/18/2007 Document Revised: 12/08/2015 Document Reviewed: 10/20/2015 Elsevier Interactive Patient Education  2017 Else[employer] Inc.  Cough, Adult A cough helps to clear your throat and lungs. A cough may last only 2-3 weeks (acute), or it may last longer than 8 weeks (chronic). Many different things can cause a cough. A cough may be a sign of an illness or another medical condition. Follow these instructions at  home:  Pay attention to any changes in your cough.  Take medicines only as told by your doctor. ? If you were prescribed an antibiotic medicine, take it as told by your doctor. Do not stop taking it even if you start to feel better. ? Talk with your doctor before you try using a cough medicine.  Drink enough fluid to keep your pee (urine) clear or pale yellow.  If the air is dry, use a cold steam vaporizer or humidifier in your home.  Stay away from things that make you cough at work or at home.  If your cough is worse at night, try using extra pillows to raise your head up higher while you sleep.  Do not smoke, and try not to be around smoke. If you need help quitting, ask your doctor.  Do not have caffeine.  Do not drink alcohol.  Rest as needed. Contact a doctor if:  You have new problems (symptoms).  You cough  up yellow fluid (pus).  Your cough does not get better after 2-3 weeks, or your cough gets worse.  Medicine does not help your cough and you are not sleeping well.  You have pain that gets worse or pain that is not helped with medicine.  You have a fever.  You are losing weight and you do not know why.  You have night sweats. Get help right away if:  You cough up blood.  You have trouble breathing.  Your heartbeat is very fast. This information is not intended to replace advice given to you by your health care provider. Make sure you discuss any questions you have with your health care provider. Document Released: 01/12/2011 Document Revised: 10/07/2015 Document Reviewed: 07/08/2014 Elsevier Interactive Patient Education  [employer].

## 2017-01-24 NOTE — Progress Notes (Signed)
   Subjective:    Patient ID: GEORGINA KRIST, female    DOB: 10-21-1975, 41 y.o.   MRN: [id number]  HPI 41 yo female comes in today with cough productive white/clear x 5 days. Thinks she got it from her daughter who brought it home from school.  No fever, no shortness of breath.Wheezing on/off. Smoker 1/2 ppd when well.   Review of Systems  Constitutional: Negative for chills, fatigue and fever.  HENT: Positive for congestion, postnasal drip and sore throat. Negative for ear pain, rhinorrhea, sinus pain and sinus pressure.   Eyes: Negative for discharge and itching.  Respiratory: Positive for cough and wheezing. Negative for shortness of breath.   Gastrointestinal: Negative for abdominal pain.  Endocrine: Negative for polydipsia, polyphagia and polyuria.  Genitourinary: Negative for dysuria.  Musculoskeletal: Negative for back pain and myalgias.  Skin: Negative for rash.  Allergic/Immunologic: Negative for environmental allergies and food allergies.  Neurological: Negative for dizziness, syncope, light-headedness and headaches.  Hematological: Positive for adenopathy.  Psychiatric/Behavioral: Negative for agitation and suicidal ideas. The patient is not nervous/anxious.        Objective:   Physical Exam  Constitutional: She is oriented to person, place, and time. She appears well-developed and well-nourished.  HENT:  Head: Normocephalic and atraumatic.  Right Ear: External ear normal.  Left Ear: External ear normal.  Mouth/Throat: Oropharynx is clear and moist.  Eyes: Pupils are equal, round, and reactive to light. Conjunctivae and EOM are normal.  Neck: Normal range of motion. Neck supple.  Cardiovascular: Normal rate, regular rhythm and normal heart sounds.   Pulmonary/Chest: Effort normal and breath sounds normal.  Musculoskeletal: Normal range of motion.  Neurological: She is alert and oriented to person, place, and time.  Skin: Skin is warm and dry.  Psychiatric: She has a  normal mood and affect. Her behavior is normal. Judgment and thought content normal.  Nursing note and vitals reviewed.  Cough noted in room, harsh breath sounds bilaterally Erythema right turbinate.       Assessment & Plan:  Bronchitis, cough , smoker. Meds ordered this encounter  Medications  . azithromycin (ZITHROMAX) 250 MG tablet    Sig: Take 2 tablets by mouth today then one tablet days 2-5 take with food.    Dispense:  6 tablet    Refill:  0  . predniSONE (STERAPRED UNI-PAK 21 TAB) 10 MG (21) TBPK tablet    Sig: Take 6 tablets by mouth today , then 5 tablets tomorrow then one tablet less each day thereafter. Take with food.    Dispense:  21 tablet    Refill:  0  . albuterol (PROVENTIL HFA;VENTOLIN HFA) 108 (90 Base) MCG/ACT inhaler    Sig: Inhale 2 puffs into the lungs every 6 (six) hours as needed for wheezing or shortness of breath.    Dispense:  1 Inhaler    Refill:  0  . benzonatate (TESSALON) 100 MG capsule    Sig: Take 1 capsule (100 mg total) by mouth 2 (two) times daily as needed for cough.    Dispense:  20 capsule    Refill:  0  Will cover for secondary infection due to patient being a smoker.  Return to clinic if not improving in 3-5 days. Sooner if any concerns.  Does not need work note , off till Friday night. Patient verbalizes understanding of medications and has no questions at discharge.

## 2017-04-30 ENCOUNTER — Ambulatory Visit: Payer: Self-pay | Admitting: Medical

## 2017-04-30 ENCOUNTER — Encounter: Payer: Self-pay | Admitting: Medical

## 2017-04-30 VITALS — BP 110/80 | HR 87 | Temp 98.3°F | Resp 18 | Wt 195.8 lb

## 2017-04-30 DIAGNOSIS — M25561 Pain in right knee: Secondary | ICD-10-CM

## 2017-04-30 NOTE — Progress Notes (Signed)
   Subjective:    Patient ID: Penny [name], female    DOB: 06/01/1975, 41 y.o.   MRN: [id number]  HPI 41 yo female in non acute distress was playing with son in yard yesterday, planted the foot to throw a snowball and felt sharp pain in knee front and lateral side of knee . Has crutches since surgery lateral meniscus repair  And lateral tibial plateau bone edema June  28th and injections  Per patient due to  Microrfractures. Seen by Dr. Cherly Beach at [facility].Pain radiates into lower part of knee but not into lower leg.  Pain with flexion and extension 2/10. Taking  Ibuprofen 600 mg every 4 hours and ice to the area.Yesterday initial pain was an  8/10 lasting a couple of seconds once she decreased the weight on right leg pain  5-6-6/10, pain continued to decreased with ice and Ibuprofen.    Review of Systems  Constitutional: Negative for chills and fever.  HENT: Negative for ear pain and sore throat.   Eyes: Negative for discharge and itching.  Respiratory: Negative for cough and shortness of breath.   Cardiovascular: Positive for leg swelling (at the right knee). Negative for chest pain and palpitations.  Gastrointestinal: Negative for abdominal pain.  Endocrine: Negative for polydipsia, polyphagia and polyuria.  Genitourinary: Negative for dysuria.  Musculoskeletal: Positive for gait problem and joint swelling. Negative for myalgias.  Skin: Negative for rash.  Allergic/Immunologic: Negative for environmental allergies and food allergies.  Neurological: Negative for dizziness, syncope and light-headedness.  Hematological: Negative for adenopathy.  Psychiatric/Behavioral: Negative for behavioral problems, self-injury and suicidal ideas. The patient is not nervous/anxious.       Objective:   Physical Exam  Constitutional: She is oriented to person, place, and time. She appears well-developed and well-nourished.  HENT:  Head: Normocephalic and atraumatic.  Eyes: Conjunctivae  and EOM are normal. Pupils are equal, round, and reactive to light.  Musculoskeletal: Normal range of motion. She exhibits edema and tenderness. She exhibits no deformity.  Neurological: She is alert and oriented to person, place, and time.  Skin: Skin is warm and dry.  Psychiatric: She has a normal mood and affect. Her behavior is normal. Judgment and thought content normal.  Nursing note and vitals reviewed.   Walking gingerly on  Right leg. Is weight bearing .  Pain on lateral and medial drawers test , no laxity  2+ popliteal and PT pulses. patent able to  Extend and Flex knee patient rates pain a 3/ 10.     Assessment & Plan:   Knee pain  Right History of  Surgery June  2018. following worker [employer]. Patient declines crutches here or work note. Ace wrap applied. She will try to get an appointment with Dr. Theda Sers if she needs a referral she will let me know.  She will contact me with the date and time of her appointment.  Patient unable to get appointment, referral done to Dr. [facility].

## 2017-04-30 NOTE — Patient Instructions (Addendum)
Contact me about your appointment .   Knee Pain, Adult Many things can cause knee pain. The pain often goes away on its own with time and rest. If the pain does not go away, tests may be done to find out what is causing the pain. Follow these instructions at home: Activity  Rest your knee.  Do not do things that cause pain.  Avoid activities where both feet leave the ground at the same time (high-impact activities). Examples are running, jumping rope, and doing jumping jacks. General instructions  Take medicines only as told by your doctor.  Raise (elevate) your knee when you are resting. Make sure your knee is higher than your heart.  Sleep with a pillow under your knee.  If told, put ice on the knee: ? Put ice in a plastic bag. ? Place a towel between your skin and the bag. ? Leave the ice on for 20 minutes, 2-3 times a day.  Ask your doctor if you should wear an elastic knee support.  Lose weight if you are overweight. Being overweight can make your knee hurt more.  Do not use any tobacco products. These include cigarettes, chewing tobacco, or electronic cigarettes. If you need help quitting, ask your doctor. Smoking may slow down healing. Contact a doctor if:  The pain does not stop.  The pain changes or gets worse.  You have a fever along with knee pain.  Your knee gives out or locks up.  Your knee swells, and becomes worse. Get help right away if:  Your knee feels warm.  You cannot move your knee.  You have very bad knee pain.  You have chest pain.  You have trouble breathing. Summary  Many things can cause knee pain. The pain often goes away on its own with time and rest.  Avoid activities that put stress on your knee. These include running and jumping rope.  Get help right away if you cannot move your knee, or if your knee feels warm, or if you have trouble breathing. This information is not intended to replace advice given to you by your health  care provider. Make sure you discuss any questions you have with your health care provider. Document Released: 07/28/2008 Document Revised: 04/25/2016 Document Reviewed: 04/25/2016 Elsevier Interactive Patient Education  2017 [employer].

## 2017-05-28 ENCOUNTER — Telehealth: Payer: Self-pay

## 2017-05-28 NOTE — Telephone Encounter (Signed)
CAlled pt to f/u on referral to Dr. Theda Sers, Orthopedic and pt states she had appt 0n 12/28

## 2019-02-27 ENCOUNTER — Other Ambulatory Visit: Payer: Self-pay

## 2019-02-27 DIAGNOSIS — Z20822 Contact with and (suspected) exposure to covid-19: Secondary | ICD-10-CM

## 2019-03-01 LAB — NOVEL CORONAVIRUS, NAA: SARS-CoV-2, NAA: NOT DETECTED

## 2019-12-02 ENCOUNTER — Other Ambulatory Visit: Payer: Self-pay | Admitting: Internal Medicine

## 2019-12-02 ENCOUNTER — Other Ambulatory Visit: Payer: Self-pay

## 2019-12-02 DIAGNOSIS — O09521 Supervision of elderly multigravida, first trimester: Secondary | ICD-10-CM

## 2019-12-02 DIAGNOSIS — O3680X0 Pregnancy with inconclusive fetal viability, not applicable or unspecified: Secondary | ICD-10-CM

## 2019-12-02 NOTE — Progress Notes (Deleted)
Pt cancelled appointment because she did not want the financial burden if her [facility] insurance did not cover cost of Ultrasound here at [facility].

## 2020-03-08 ENCOUNTER — Other Ambulatory Visit: Payer: Self-pay | Admitting: Orthopedic Surgery

## 2020-03-08 DIAGNOSIS — S46012A Strain of muscle(s) and tendon(s) of the rotator cuff of left shoulder, initial encounter: Secondary | ICD-10-CM

## 2020-03-31 ENCOUNTER — Other Ambulatory Visit: Payer: Self-pay

## 2020-06-25 HISTORY — PX: DILATION AND CURETTAGE, DIAGNOSTIC / THERAPEUTIC: SUR384

## 2020-10-30 ENCOUNTER — Other Ambulatory Visit: Payer: Self-pay

## 2020-10-30 ENCOUNTER — Emergency Department
Admission: EM | Admit: 2020-10-30 | Discharge: 2020-10-30 | Disposition: A | Discharge status: Home / Self Care | Payer: Medicaid Other | Attending: Student in an Organized Health Care Education/Training Program | Admitting: Student in an Organized Health Care Education/Training Program

## 2020-10-30 ENCOUNTER — Emergency Department: Payer: Medicaid Other

## 2020-10-30 DIAGNOSIS — M79671 Pain in right foot: Secondary | ICD-10-CM | POA: Insufficient documentation

## 2020-10-30 DIAGNOSIS — Z91040 Latex allergy status: Secondary | ICD-10-CM | POA: Diagnosis not present

## 2020-10-30 DIAGNOSIS — Z79899 Other long term (current) drug therapy: Secondary | ICD-10-CM | POA: Insufficient documentation

## 2020-10-30 DIAGNOSIS — Z87891 Personal history of nicotine dependence: Secondary | ICD-10-CM | POA: Insufficient documentation

## 2020-10-30 DIAGNOSIS — M7731 Calcaneal spur, right foot: Secondary | ICD-10-CM | POA: Diagnosis not present

## 2020-10-30 DIAGNOSIS — E039 Hypothyroidism, unspecified: Secondary | ICD-10-CM | POA: Insufficient documentation

## 2020-10-30 HISTORY — DX: Localized swelling, mass and lump, trunk: R22.2

## 2020-10-30 HISTORY — DX: Other pulmonary embolism without acute cor pulmonale: I26.99

## 2020-10-30 HISTORY — DX: Acute embolism and thrombosis of unspecified deep veins of unspecified lower extremity: I82.409

## 2020-10-30 NOTE — ED Notes (Signed)
E signature pad not working. Pt educated on discharge instructions and verbalized understanding.  

## 2020-10-30 NOTE — ED Triage Notes (Signed)
Pt in from home d/t foot/heel pain post surgery. Was briefly off xarelto for surgery; taken for previous DVTs. No obvious mechanism of injury per pt. C/o pain at ball of R foot. R foot not obviously deformed or swollen in comparison to L.

## 2020-10-30 NOTE — Discharge Instructions (Addendum)
Your exam and x-ray are consistent with a heel spur on the right foot causing acute heel pain.  You should wear cushioned heeled shoes for comfort.  Follow-up with podiatry for ongoing management.

## 2020-10-30 NOTE — ED Provider Notes (Signed)
[facility] Emergency Department Provider Note ____________________________________________  Time seen: 2022  I have reviewed the triage vital signs and the nursing notes.  HISTORY  Chief Complaint  Foot Pain   HPI NYEMA HACHEY is a 45 y.o. female with the below medical history, presents to the ED with sudden onset of right heel pain.  Patient recently had a bladder sling operation performed about 4 days prior.  She discontinued her Xarelto 48 hours prior to surgery, and restarted without incident after.  She presents today after she began experience some pain to the heel of her right foot, denies any previous injury or trauma.  She denies any calf pain, leg swelling, shortness of breath, or chest pain.  Past Medical History:  Diagnosis Date   Complication of anesthesia    felt sedation lasted several days in '90's w/knee surgery   DVT (deep venous thrombosis) (HCC)    Hypothyroidism    Mass in chest    Pulmonary emboli [facility])     Patient Active Problem List   Diagnosis Date Noted   S/P arthroscopy of right knee 11/09/2016   Hypothyroidism 09/13/2016    Past Surgical History:  Procedure Laterality Date   CHOLECYSTECTOMY  2001   CHONDROPLASTY Right 11/09/2016   Procedure: RIGHT KNEE ARTHROSCOPY, PARTIAL MEDIAL [place], LATERAL TIBIAL PLATEAU SUBCHONDROPLASTY;  Surgeon: Sydnee Cabal, MD;  Location: [place];  Service: Orthopedics;  Laterality: Right;   KNEE SURGERY Right [id number]   meniscus repair first-ACL repair in '98   THYROIDECTOMY  2011    Prior to Admission medications   Medication Sig Start Date End Date Taking? Authorizing Provider  ibuprofen (ADVIL,MOTRIN) 600 MG tablet Take 600 mg by mouth every 4 (four) hours as needed.    [provider]  levothyroxine (SYNTHROID, LEVOTHROID) 300 MCG tablet Take 250 mcg by mouth daily.  09/06/16   [provider]    Allergies Codeine, Prednisone, Latex, and  Sulfa antibiotics  History reviewed. No pertinent family history.  Social History Social History   Tobacco Use   Smoking status: Former    Packs/day: 0.50    Pack years: 0.00    Types: Cigarettes   Smokeless tobacco: Never  Vaping Use   Vaping Use: Never used  Substance Use Topics   Alcohol use: No   Drug use: Never    Review of Systems  Constitutional: Negative for fever. Cardiovascular: Negative for chest pain. Respiratory: Negative for shortness of breath. Gastrointestinal: Negative for abdominal pain, vomiting and diarrhea. Genitourinary: Negative for dysuria. Musculoskeletal: Negative for back pain.  Right heel pain as above. Skin: Negative for rash. Neurological: Negative for headaches, focal weakness or numbness. ____________________________________________  PHYSICAL EXAM:  VITAL SIGNS: ED Triage Vitals  Enc Vitals Group     BP 10/30/20 1940 (!) 167/97     Pulse Rate 10/30/20 1940 (!) 59     Resp 10/30/20 1940 19     Temp 10/30/20 1940 98.1 F (36.7 C)     Temp Source 10/30/20 1940 Oral     SpO2 10/30/20 1940 99 %     Weight 10/30/20 1942 195 lb (88.5 kg)     Height 10/30/20 1942 5\' 7"  (1.702 m)     Head Circumference --      Peak Flow --      Pain Score 10/30/20 1941 2     Pain Loc --      Pain Edu? --      Excl. in [place]? --  Constitutional: Alert and oriented. Well appearing and in no distress. Head: Normocephalic and atraumatic. Eyes: Conjunctivae are normal. Normal extraocular movements Cardiovascular: Normal rate, regular rhythm. Normal distal pulses and cap refill. Respiratory: Normal respiratory effort.  Musculoskeletal: Right foot and heel without obvious deformity, dislocation, or joint effusions.  Patient with normal active range of motion to the ankle.  No significant calf or Achilles tenderness is elicited.  She is mildly tender to palpation to the medial calcaneal heel at the plantar surface.  Nontender with normal range of motion in all  extremities.  Neurologic:  Normal g gross sensation. Normal speech and language. No gross focal neurologic deficits are appreciated. Skin:  Skin is warm, dry and intact. No rash noted. ____________________________________________   RADIOLOGY  DG Right Foot  IMPRESSION: 1. Small inferior calcaneal spur. Otherwise unremarkable right foot. ____________________________________________  PROCEDURES  Procedures ____________________________________________   INITIAL IMPRESSION / ASSESSMENT AND PLAN / ED COURSE  As part of my medical decision making, I reviewed the following data within the [place] reviewed as noted and Notes from prior ED visits  DDX: heel spur, plantar fasciitis, ankle sprain   Patient presents to the ED with acute medial calcaneal heel pain without preceding injury or trauma.  Clinically the patient's exam is overall benign and reassuring this time.  Mildly tender to palpation to the medial heel with compression.  X-ray does reveal a small calcaneal spur.  Patient is stable and reassuring at this time.  She will be referred to podiatry for ongoing management.  Return precautions have been discussed.   BRIENNE LIGUORI was evaluated in Emergency Department on 10/30/2020 for the symptoms described in the history of present illness. She was evaluated in the context of the global COVID-19 pandemic, which necessitated consideration that the patient might be at risk for infection with the SARS-CoV-2 virus that causes COVID-19. Institutional protocols and algorithms that pertain to the evaluation of patients at risk for COVID-19 are in a state of rapid change based on information released by regulatory bodies including the CDC and federal and state organizations. These policies and algorithms were followed during the patient's care in the ED. ____________________________________________  FINAL CLINICAL IMPRESSION(S) / ED DIAGNOSES  Final diagnoses:   Pain of right heel  Heel spur, right      Williemae Muriel, Dannielle Karvonen, PA-C 10/30/20 2116    Merlyn Lot, MD 10/30/20 2227

## 2020-10-30 NOTE — ED Triage Notes (Signed)
Denies difficulty breathing or CP. In NAD.

## 2020-12-13 ENCOUNTER — Other Ambulatory Visit
Admission: RE | Admit: 2020-12-13 | Discharge: 2020-12-13 | Disposition: A | Discharge status: Home / Self Care | Payer: Medicaid Other | Source: Ambulatory Visit | Attending: Emergency Medicine | Admitting: Emergency Medicine

## 2020-12-13 DIAGNOSIS — Z86718 Personal history of other venous thrombosis and embolism: Secondary | ICD-10-CM | POA: Diagnosis present

## 2020-12-13 DIAGNOSIS — Z09 Encounter for follow-up examination after completed treatment for conditions other than malignant neoplasm: Secondary | ICD-10-CM | POA: Diagnosis not present

## 2020-12-13 LAB — D-DIMER, QUANTITATIVE: D-Dimer, Quant: 1.21 ug/mL-FEU — ABNORMAL HIGH (ref 0.00–0.50)

## 2021-01-18 ENCOUNTER — Other Ambulatory Visit
Admission: RE | Admit: 2021-01-18 | Discharge: 2021-01-18 | Disposition: A | Discharge status: Home / Self Care | Payer: Medicaid Other | Source: Ambulatory Visit | Attending: Emergency Medicine | Admitting: Emergency Medicine

## 2021-01-18 DIAGNOSIS — Z09 Encounter for follow-up examination after completed treatment for conditions other than malignant neoplasm: Secondary | ICD-10-CM | POA: Diagnosis not present

## 2021-01-18 DIAGNOSIS — Z86718 Personal history of other venous thrombosis and embolism: Secondary | ICD-10-CM | POA: Insufficient documentation

## 2021-01-18 LAB — D-DIMER, QUANTITATIVE: D-Dimer, Quant: 1.32 ug/mL-FEU — ABNORMAL HIGH (ref 0.00–0.50)

## 2021-03-19 HISTORY — PX: OTHER SURGICAL HISTORY: SHX169

## 2021-12-29 ENCOUNTER — Emergency Department
Admission: EM | Admit: 2021-12-29 | Discharge: 2021-12-29 | Disposition: A | Discharge status: Home / Self Care | Payer: Medicaid Other | Attending: Emergency Medicine | Admitting: Emergency Medicine

## 2021-12-29 ENCOUNTER — Encounter: Payer: Self-pay | Admitting: Emergency Medicine

## 2021-12-29 ENCOUNTER — Other Ambulatory Visit: Payer: Self-pay

## 2021-12-29 ENCOUNTER — Emergency Department: Payer: Medicaid Other

## 2021-12-29 DIAGNOSIS — M546 Pain in thoracic spine: Secondary | ICD-10-CM | POA: Diagnosis present

## 2021-12-29 DIAGNOSIS — X500XXA Overexertion from strenuous movement or load, initial encounter: Secondary | ICD-10-CM | POA: Insufficient documentation

## 2021-12-29 DIAGNOSIS — E039 Hypothyroidism, unspecified: Secondary | ICD-10-CM | POA: Diagnosis not present

## 2021-12-29 MED ORDER — LIDOCAINE 5 % EX PTCH: 1.0000 | MEDICATED_PATCH | Freq: Two times a day (BID) | CUTANEOUS | 0 refills | Status: AC

## 2021-12-29 MED ORDER — LIDOCAINE 5 % EX PTCH
1.0000 | MEDICATED_PATCH | CUTANEOUS | Status: DC
  Administered 2021-12-29: 1 via TRANSDERMAL
  Filled 2021-12-29: qty 1

## 2021-12-29 MED ORDER — CYCLOBENZAPRINE HCL 5 MG PO TABS
5.0000 mg | ORAL_TABLET | Freq: Every day | ORAL | 0 refills | Status: AC
Start: 2021-12-29 — End: ?

## 2021-12-29 NOTE — Discharge Instructions (Signed)
Your x-ray did not reveal any abnormalities.  You may take Tylenol per package instructions in addition to the Lidoderm patches as needed for pain.  You may also take the muscle relaxants, but remember that you cannot drive, operate heavy machinery, or for any test that require concentration while taking this medication. Please return to the emergency department for any new, worsening, or changing symptoms or other concerns including weakness in your arms or legs, legs, urinary or stool incontinence or retention, numbness or tingling, chest pain, trouble breathing, fevers, or any other concerns or change in symptoms.

## 2021-12-29 NOTE — ED Provider Notes (Signed)
[facility] Provider Note    Event Date/Time   First MD Initiated Contact with Patient 12/29/21 1302     (approximate)   History   Back Pain (Pt. To ED from home for upper, midline back pain after carrying large, heavy object this AM. Pt. States she felt a "squish" in her back and immediately felt pain. Pt. Took '1000mg'$  tylenol at home pta. Hx. Of left shoulder injury.)   HPI  Penny [name] is a 46 y.o. female who presents today for evaluation of back pain.  Patient reports that she was carrying a large heavy object this morning and while she was carrying the object she felt pain in her back.  She reports that the object weighed several 100 pounds.  She reports that the pain stays directly in her back.  It does not radiate into her chest or into her arms.  She denies weakness in her arms or legs.  She denies chest pain or shortness of breath.  She reports that she put ice on the area and feels better.  Patient Active Problem List   Diagnosis Date Noted   S/P arthroscopy of right knee 11/09/2016   Hypothyroidism 09/13/2016          Physical Exam   Triage Vital Signs: ED Triage Vitals  Enc Vitals Group     BP 12/29/21 1158 (!) 140/98     Pulse Rate 12/29/21 1158 68     Resp 12/29/21 1158 18     Temp 12/29/21 1158 97.9 F (36.6 C)     Temp Source 12/29/21 1158 Oral     SpO2 12/29/21 1158 100 %     Weight 12/29/21 1200 205 lb (93 kg)     Height 12/29/21 1200 '5\' 7"'$  (1.702 m)     Head Circumference --      Peak Flow --      Pain Score 12/29/21 1200 5     Pain Loc --      Pain Edu? --      Excl. in [place]? --     Most recent vital signs: Vitals:   12/29/21 1158  BP: (!) 140/98  Pulse: 68  Resp: 18  Temp: 97.9 F (36.6 C)  SpO2: 100%    Physical Exam Vitals and nursing note reviewed.  Constitutional:      General: Awake and alert. No acute distress.    Appearance: Normal appearance. The patient is overweight.  HENT:     Head:  Normocephalic and atraumatic.     Mouth: Mucous membranes are moist.  Eyes:     General: PERRL. Normal EOMs        Right eye: No discharge.        Left eye: No discharge.     Conjunctiva/sclera: Conjunctivae normal.  Cardiovascular:     Rate and Rhythm: Normal rate and regular rhythm.     Pulses: Normal pulses.     Heart sounds: Normal heart sounds Pulmonary:     Effort: Pulmonary effort is normal. No respiratory distress.     Breath sounds: Normal breath sounds.  Abdominal:     Abdomen is soft. There is no abdominal tenderness. No rebound or guarding. No distention. Back: No midline C or L-spine tenderness.  There is mild thoracic midline and paraspinal muscle tenderness.  Strength and sensation 5/5 to bilateral lower extremities. Normal great toe extension against resistance. Normal sensation throughout feet. Normal patellar reflexes. Negative SLR and opposite SLR bilaterally. Negative  FABER test Musculoskeletal:        General: No swelling. Normal range of motion.     Cervical back: Normal range of motion and neck supple.  No midline cervical spine tenderness.  Full range of motion of neck.  Negative Spurling test.  Negative Lhermitte sign.  Normal strength and sensation in bilateral upper extremities. Normal grip strength bilaterally.  Normal intrinsic muscle function of the hand bilaterally.  Normal radial pulses bilaterally. Skin:    General: Skin is warm and dry.     Capillary Refill: Capillary refill takes less than 2 seconds.     Findings: No rash.  Neurological:     Mental Status: The patient is awake and alert.      ED Results / Procedures / Treatments   Labs (all labs ordered are listed, but only abnormal results are displayed) Labs Reviewed - No data to display   EKG     RADIOLOGY I independently reviewed and interpreted imaging and agree with radiologists findings.     PROCEDURES:  Critical Care performed:   Procedures   MEDICATIONS ORDERED IN  ED: Medications  lidocaine (LIDODERM) 5 % 1 patch (1 patch Transdermal Patch Applied 12/29/21 1320)     IMPRESSION / MDM / ASSESSMENT AND PLAN / ED COURSE  I reviewed the triage vital signs and the nursing notes.   Differential diagnosis includes, but is not limited to, fracture, muscle spasm, disc injury.  Patient has a clear mechanism of injury and felt the pain while she was lifting the heavy object.  She has no radicular symptoms.  There is no weakness of her extremities.  She has normal strength and sensation in all 4 extremities.  She has localized tenderness in the area of her injury.  X-ray was obtained and is negative for any acute findings.  No evidence of compression fracture.  No joint space narrowing.  She does not have any radiation of pain into her arms or legs or around her chest to suggest radiculopathy.  She was treated symptomatically with improvement of her symptoms.  She is able to ambulate with steady gait.  She did request muscle relaxants which were prescribed, though she was reminded that she cannot drive, operate heavy machinery, or perform any test that require concentration while taking this medications.  We discussed return precautions.  She was given the information for neurosurgery if she continues to have pain.  Patient understands and agrees with plan.  She was discharged with her daughter.   Patient's presentation is most consistent with acute complicated illness / injury requiring diagnostic workup.      FINAL CLINICAL IMPRESSION(S) / ED DIAGNOSES   Final diagnoses:  Thoracic spine pain     Rx / DC Orders   ED Discharge Orders          Ordered    lidocaine (LIDODERM) 5 %  Every 12 hours        12/29/21 1417    cyclobenzaprine (FLEXERIL) 5 MG tablet  Daily at bedtime        12/29/21 1417             Note:  This document was prepared using Dragon voice recognition software and may include unintentional dictation errors.   Emeline Gins 12/29/21 1428    Rada [place], MD 12/29/21 [phone]

## 2021-12-29 NOTE — ED Triage Notes (Signed)
Pt. To ED from home for upper, midline back pain after carrying large, heavy object this AM. Pt. States she felt a "squish" in her back and immediately felt pain. Pt. Took '1000mg'$  tylenol at home pta. Hx. Of left shoulder injury.

## 2022-01-03 ENCOUNTER — Other Ambulatory Visit: Payer: Self-pay | Admitting: Internal Medicine

## 2022-01-03 DIAGNOSIS — Z1231 Encounter for screening mammogram for malignant neoplasm of breast: Secondary | ICD-10-CM

## 2022-02-01 NOTE — H&P (Signed)
Pre-Procedure H&P   Patient ID: Penny [name] is a 46 y.o. female.  Gastroenterology Provider: Annamaria Helling, DO  PCP: Kirk Ruths, MD  Date: 02/02/2022  HPI Ms. Penny [name] is a 46 y.o. female who presents today for Colonoscopy for Screening colonoscopy.  Patient undergoing initial screening colonoscopy.  She is on Eliquis which has been held for 2 days.  She has no family history of colon cancer or colon polyps.  She is on iron supplementation however this is due to history of heavy vaginal bleeding.  She is on iron supplementation.  Most recent studies TIBC 385% saturation 10.  Bowel movements are very regular without melena or hematochezia  H. pylori stool negative and celiac serum negative.  A1c 6.1 hemoglobin 14.2 MCV 88 platelets 206,000  Also has a history of CKD.  Does have a family history of factor V Leiden mutation as well as ovarian cancer (mother age 63).   Past Medical History:  Diagnosis Date   Complication of anesthesia    felt sedation lasted several days in '90's w/knee surgery   DVT (deep venous thrombosis) (HCC)    Hypothyroidism    Mass in chest    Pulmonary emboli [facility])     Past Surgical History:  Procedure Laterality Date   CHOLECYSTECTOMY  2001   CHONDROPLASTY Right 11/09/2016   Procedure: RIGHT KNEE ARTHROSCOPY, PARTIAL MEDIAL [place], LATERAL TIBIAL PLATEAU SUBCHONDROPLASTY;  Surgeon: Sydnee Cabal, MD;  Location: [place];  Service: Orthopedics;  Laterality: Right;   KNEE SURGERY Right [id number]   meniscus repair first-ACL repair in '98   THYROIDECTOMY  2011    Family History No h/o GI disease or malignancy  Review of Systems  Constitutional:  Negative for activity change, appetite change, chills, diaphoresis, fatigue, fever and unexpected weight change.  HENT:  Negative for trouble swallowing and voice change.   Respiratory:  Negative for shortness of breath and wheezing.    Cardiovascular:  Negative for chest pain, palpitations and leg swelling.  Gastrointestinal:  Negative for abdominal distention, abdominal pain, anal bleeding, blood in stool, constipation, diarrhea, nausea, rectal pain and vomiting.  Musculoskeletal:  Negative for arthralgias and myalgias.  Skin:  Negative for color change and pallor.  Neurological:  Negative for dizziness, syncope and weakness.  Psychiatric/Behavioral:  Negative for confusion.   All other systems reviewed and are negative.    Medications No current facility-administered medications on file prior to encounter.   Current Outpatient Medications on File Prior to Encounter  Medication Sig Dispense Refill   apixaban (ELIQUIS) 5 MG TABS tablet Take 5 mg by mouth 2 (two) times daily.     levothyroxine (SYNTHROID, LEVOTHROID) 300 MCG tablet Take 250 mcg by mouth daily.      cyclobenzaprine (FLEXERIL) 5 MG tablet Take 1 tablet (5 mg total) by mouth at bedtime. 10 tablet 0   ibuprofen (ADVIL,MOTRIN) 600 MG tablet Take 600 mg by mouth every 4 (four) hours as needed.      Pertinent medications related to GI and procedure were reviewed by me with the patient prior to the procedure   Current Facility-Administered Medications:    0.9 %  sodium chloride infusion, , Intravenous, Continuous, Annamaria Helling, DO, Last Rate: 20 mL/hr at 02/02/22 0724, New Bag at 02/02/22 2694      Allergies  Allergen Reactions   Codeine Shortness Of Breath    Nausea,dizziness when took a dose by mistake-no breathing problem at that time  Prednisone     Hot flashes/flushing   Latex Rash   Sulfa Antibiotics Rash   Allergies were reviewed by me prior to the procedure  Objective   Body mass index is 32.89 kg/m. Vitals:   02/02/22 0704  BP: 138/85  Pulse: 68  Resp: 16  Temp: (!) 96.5 F (35.8 C)  TempSrc: Temporal  SpO2: 100%  Weight: 95.3 kg  Height: '5\' 7"'$  (1.702 m)     Physical Exam Vitals and nursing note reviewed.   Constitutional:      General: She is not in acute distress.    Appearance: Normal appearance. She is not ill-appearing, toxic-appearing or diaphoretic.  HENT:     Head: Normocephalic and atraumatic.     Nose: Nose normal.     Mouth/Throat:     Mouth: Mucous membranes are moist.     Pharynx: Oropharynx is clear.  Eyes:     General: No scleral icterus.    Extraocular Movements: Extraocular movements intact.  Cardiovascular:     Rate and Rhythm: Normal rate and regular rhythm.     Heart sounds: Normal heart sounds. No murmur heard.    No friction rub. No gallop.  Pulmonary:     Effort: Pulmonary effort is normal. No respiratory distress.     Breath sounds: Normal breath sounds. No wheezing, rhonchi or rales.  Abdominal:     General: Bowel sounds are normal. There is no distension.     Palpations: Abdomen is soft.     Tenderness: There is no abdominal tenderness. There is no guarding or rebound.  Musculoskeletal:     Cervical back: Neck supple.     Right lower leg: No edema.     Left lower leg: No edema.  Skin:    General: Skin is warm and dry.     Coloration: Skin is not jaundiced or pale.  Neurological:     General: No focal deficit present.     Mental Status: She is alert and oriented to person, place, and time. Mental status is at baseline.  Psychiatric:        Mood and Affect: Mood normal.        Behavior: Behavior normal.        Thought Content: Thought content normal.        Judgment: Judgment normal.     Assessment:  Ms. Penny [name] is a 46 y.o. female  who presents today for Colonoscopy for Screening colonoscopy.  Plan:  Colonoscopy with possible intervention today  Colonoscopy with possible biopsy, control of bleeding, polypectomy, and interventions as necessary has been discussed with the patient/patient representative. Informed consent was obtained from the patient/patient representative after explaining the indication, nature, and risks of the procedure  including but not limited to death, bleeding, perforation, missed neoplasm/lesions, cardiorespiratory compromise, and reaction to medications. Opportunity for questions was given and appropriate answers were provided. Patient/patient representative has verbalized understanding is amenable to undergoing the procedure.   Annamaria Helling, DO  [facility]  Portions of the record may have been created with voice recognition software. Occasional wrong-word or 'sound-a-like' substitutions may have occurred due to the inherent limitations of voice recognition software.  Read the chart carefully and recognize, using context, where substitutions may have occurred.

## 2022-02-02 ENCOUNTER — Ambulatory Visit: Payer: Medicaid Other | Admitting: Certified Registered"

## 2022-02-02 ENCOUNTER — Ambulatory Visit
Admission: RE | Admit: 2022-02-02 | Discharge: 2022-02-02 | Disposition: A | Discharge status: Home / Self Care | Payer: Medicaid Other | Attending: Gastroenterology | Admitting: Gastroenterology

## 2022-02-02 ENCOUNTER — Other Ambulatory Visit: Payer: Self-pay

## 2022-02-02 ENCOUNTER — Encounter
Admission: RE | Disposition: A | Discharge status: Home / Self Care | Payer: Self-pay | Source: Home / Self Care | Attending: Gastroenterology

## 2022-02-02 ENCOUNTER — Encounter: Payer: Self-pay | Admitting: Gastroenterology

## 2022-02-02 DIAGNOSIS — D123 Benign neoplasm of transverse colon: Secondary | ICD-10-CM | POA: Insufficient documentation

## 2022-02-02 DIAGNOSIS — N189 Chronic kidney disease, unspecified: Secondary | ICD-10-CM | POA: Diagnosis not present

## 2022-02-02 DIAGNOSIS — Z1211 Encounter for screening for malignant neoplasm of colon: Secondary | ICD-10-CM | POA: Diagnosis present

## 2022-02-02 DIAGNOSIS — Z7901 Long term (current) use of anticoagulants: Secondary | ICD-10-CM | POA: Insufficient documentation

## 2022-02-02 DIAGNOSIS — Z86711 Personal history of pulmonary embolism: Secondary | ICD-10-CM | POA: Insufficient documentation

## 2022-02-02 DIAGNOSIS — K635 Polyp of colon: Secondary | ICD-10-CM | POA: Insufficient documentation

## 2022-02-02 DIAGNOSIS — Z87891 Personal history of nicotine dependence: Secondary | ICD-10-CM | POA: Insufficient documentation

## 2022-02-02 DIAGNOSIS — Z9049 Acquired absence of other specified parts of digestive tract: Secondary | ICD-10-CM | POA: Diagnosis not present

## 2022-02-02 DIAGNOSIS — Z86718 Personal history of other venous thrombosis and embolism: Secondary | ICD-10-CM | POA: Diagnosis not present

## 2022-02-02 DIAGNOSIS — E890 Postprocedural hypothyroidism: Secondary | ICD-10-CM | POA: Insufficient documentation

## 2022-02-02 HISTORY — PX: COLONOSCOPY: SHX5424

## 2022-02-02 LAB — POCT PREGNANCY, URINE: Preg Test, Ur: NEGATIVE

## 2022-02-02 SURGERY — COLONOSCOPY
Anesthesia: General

## 2022-02-02 MED ORDER — PROPOFOL 500 MG/50ML IV EMUL
INTRAVENOUS | Status: DC | PRN
  Administered 2022-02-02: 150 ug/kg/min via INTRAVENOUS

## 2022-02-02 MED ORDER — LIDOCAINE HCL (CARDIAC) PF 100 MG/5ML IV SOSY
PREFILLED_SYRINGE | INTRAVENOUS | Status: DC | PRN
  Administered 2022-02-02: 50 mg via INTRAVENOUS

## 2022-02-02 MED ORDER — PROPOFOL 10 MG/ML IV BOLUS
INTRAVENOUS | Status: DC | PRN
  Administered 2022-02-02: 20 mg via INTRAVENOUS
  Administered 2022-02-02: 80 mg via INTRAVENOUS
  Administered 2022-02-02: 20 mg via INTRAVENOUS

## 2022-02-02 MED ORDER — SODIUM CHLORIDE 0.9 % IV SOLN: INTRAVENOUS | Status: DC

## 2022-02-02 MED ORDER — PROPOFOL 1000 MG/100ML IV EMUL
INTRAVENOUS | Status: AC
  Filled 2022-02-02: qty 100

## 2022-02-02 NOTE — Interval H&P Note (Signed)
History and Physical Interval Note: Preprocedure H&P from 02/02/22  was reviewed and there was no interval change after seeing and examining the patient.  Written consent was obtained from the patient after discussion of risks, benefits, and alternatives. Patient has consented to proceed with Colonoscopy with possible intervention   2/77/8242 3:53 AM  Penny [name]  has presented today for surgery, with the diagnosis of Encounter for screening colonoscopy for non-high-risk patient (Z12.11).  The various methods of treatment have been discussed with the patient and family. After consideration of risks, benefits and other options for treatment, the patient has consented to  Procedure(s): COLONOSCOPY (N/A) as a surgical intervention.  The patient's history has been reviewed, patient examined, no change in status, stable for surgery.  I have reviewed the patient's chart and labs.  Questions were answered to the patient's satisfaction.     Annamaria Helling

## 2022-02-02 NOTE — Op Note (Signed)
[facility] Patient Name: Penny [name] Procedure Date: 02/02/2022 7:21 AM MRN: [id number] Account #: [id number] Date of Birth: Sep 03, 1975 Admit Type: Outpatient Age: 46 Room: [facility] ENDO ROOM 1 Gender: Female Note Status: Finalized Instrument Name: Colonscope [id number] Procedure:             Colonoscopy Indications:           Screening for colorectal malignant neoplasm Providers:             Rueben Bash, DO Referring MD:          Annamaria Helling DO, DO (Referring MD), Kirk Ruths MD, MD (Referring MD) Medicines:             Monitored Anesthesia Care Complications:         No immediate complications. Estimated blood loss:                         Minimal. Procedure:             Pre-Anesthesia Assessment:                        - Prior to the procedure, a History and Physical was                         performed, and patient medications and allergies were                         reviewed. The patient is competent. The risks and                         benefits of the procedure and the sedation options and                         risks were discussed with the patient. All questions                         were answered and informed consent was obtained.                         Patient identification and proposed procedure were                         verified by the physician, the nurse, the anesthetist                         and the technician in the endoscopy suite. Mental                         Status Examination: alert and oriented. Airway                         Examination: normal oropharyngeal airway and neck                         mobility. Respiratory Examination: clear to  auscultation. CV Examination: RRR, no murmurs, no S3                         or S4. Prophylactic Antibiotics: The patient does not                         require prophylactic antibiotics. Prior                          Anticoagulants: The patient has taken no previous                         anticoagulant or antiplatelet agents. ASA Grade                         Assessment: II - A patient with mild systemic disease.                         After reviewing the risks and benefits, the patient                         was deemed in satisfactory condition to undergo the                         procedure. The anesthesia plan was to use monitored                         anesthesia care (MAC). Immediately prior to                         administration of medications, the patient was                         re-assessed for adequacy to receive sedatives. The                         heart rate, respiratory rate, oxygen saturations,                         blood pressure, adequacy of pulmonary ventilation, and                         response to care were monitored throughout the                         procedure. The physical status of the patient was                         re-assessed after the procedure.                        After obtaining informed consent, the colonoscope was                         passed under direct vision. Throughout the procedure,                         the patient's blood pressure, pulse, and oxygen  saturations were monitored continuously. The                         Colonoscope was introduced through the anus and                         advanced to the the terminal ileum, with                         identification of the appendiceal orifice and IC                         valve. The colonoscopy was performed without                         difficulty. The patient tolerated the procedure well.                         The quality of the bowel preparation was evaluated                         using the BBPS [facility] Bowel Preparation Scale) with                         scores of: Right Colon = 3, Transverse Colon = 3 and                          Left Colon = 3 (entire mucosa seen well with no                         residual staining, small fragments of stool or opaque                         liquid). The total BBPS score equals 9. The terminal                         ileum, ileocecal valve, appendiceal orifice, and                         rectum were photographed. Findings:      The perianal and digital rectal examinations were normal. Pertinent       negatives include normal sphincter tone.      The terminal ileum appeared normal. Estimated blood loss: none.      Two sessile polyps were found in the descending colon and transverse       colon. The polyps were 3 to 5 mm in size. These polyps were removed with       a cold snare. Resection and retrieval were complete. Estimated blood       loss was minimal.      Six sessile polyps were found in the rectum (2), descending colon (3)       and transverse colon (1). The polyps were 1 to 2 mm in size. These       polyps were removed with a jumbo cold forceps. Resection and retrieval       were complete. Estimated blood loss was minimal.      The exam was otherwise without abnormality on direct and retroflexion  views. Impression:            - The examined portion of the ileum was normal.                        - Two 3 to 5 mm polyps in the descending colon and in                         the transverse colon, removed with a cold snare.                         Resected and retrieved.                        - Six 1 to 2 mm polyps in the rectum, in the                         descending colon and in the transverse colon, removed                         with a jumbo cold forceps. Resected and retrieved.                        - The examination was otherwise normal on direct and                         retroflexion views. Recommendation:        - Patient has a contact number available for                         emergencies. The signs and symptoms of potential                          delayed complications were discussed with the patient.                         Return to normal activities tomorrow. Written                         discharge instructions were provided to the patient.                        - Discharge patient to home.                        - Resume previous diet.                        - Continue present medications.                        - No aspirin, ibuprofen, naproxen, or other                         non-steroidal anti-inflammatory drugs for 5 days after                         polyp removal.                        -  Resume Eliquis (apixaban) at prior dose in 2 days.                         Refer to managing physician for further adjustment of                         therapy.                        - Await pathology results.                        - Repeat colonoscopy for surveillance based on                         pathology results.                        - Return to referring physician as previously                         scheduled.                        - The findings and recommendations were discussed with                         the patient. Procedure Code(s):     --- Professional ---                        [phone], Colonoscopy, flexible; with removal of                         tumor(s), polyp(s), or other lesion(s) by snare                         technique                        [id number], 45, Colonoscopy, flexible; with biopsy, single                         or multiple Diagnosis Code(s):     --- Professional ---                        Z12.11, Encounter for screening for malignant neoplasm                         of colon                        K62.1, Rectal polyp                        K63.5, Polyp of colon CPT copyright 2019 American Medical Association. All rights reserved. The codes documented in this report are preliminary and upon coder review may  be revised to meet current compliance requirements. Attending Participation:       I personally performed the entire procedure. Volney American, DO Annamaria Helling DO, DO 02/02/2022 8:23:23 AM This report has been signed electronically. Number of Addenda: 0 Note Initiated On: 02/02/2022 7:21 AM  Scope Withdrawal Time: 0 hours 22 minutes 10 seconds  Total Procedure Duration: 0 hours 32 minutes 6 seconds  Estimated Blood Loss:  Estimated blood loss was minimal.      [facility]

## 2022-02-02 NOTE — Anesthesia Postprocedure Evaluation (Signed)
Anesthesia Post Note  Patient: Penny [name]  Procedure(s) Performed: COLONOSCOPY  Patient location during evaluation: PACU Anesthesia Type: General Level of consciousness: awake and awake and alert Pain management: satisfactory to patient Vital Signs Assessment: post-procedure vital signs reviewed and stable Respiratory status: nonlabored ventilation and respiratory function stable Cardiovascular status: stable Anesthetic complications: no   No notable events documented.   Last Vitals:  Vitals:   02/02/22 0704 02/02/22 0821  BP: 138/85 122/77  Pulse: 68 78  Resp: 16 18  Temp: (!) 35.8 C (!) 36.2 C  SpO2: 100% 100%    Last Pain:  Vitals:   02/02/22 0821  TempSrc: Temporal  PainSc: Asleep                 VAN STAVEREN,Matilde Markie

## 2022-02-02 NOTE — Anesthesia Procedure Notes (Signed)
Procedure Name: MAC Date/Time: 02/02/2022 7:42 AM  Performed by: Biagio Borg, CRNAPre-anesthesia Checklist: Patient identified, Emergency Drugs available, Suction available, Patient being monitored and Timeout performed Patient Re-evaluated:Patient Re-evaluated prior to induction Oxygen Delivery Method: Nasal cannula Induction Type: IV induction Placement Confirmation: positive ETCO2 and CO2 detector

## 2022-02-02 NOTE — Transfer of Care (Signed)
Immediate Anesthesia Transfer of Care Note  Patient: Penny [name]  Procedure(s) Performed: COLONOSCOPY  Patient Location: PACU and Endoscopy Unit  Anesthesia Type:General  Level of Consciousness: awake  Airway & Oxygen Therapy: Patient Spontanous Breathing  Post-op Assessment: Report given to RN and Post -op Vital signs reviewed and stable  Post vital signs: Reviewed and stable  Last Vitals:  Vitals Value Taken Time  BP 122/77 02/02/22 0821  Temp 36.2 C 02/02/22 0821  Pulse 74 02/02/22 0822  Resp 16 02/02/22 0822  SpO2 100 % 02/02/22 0822  Vitals shown include unvalidated device data.  Last Pain:  Vitals:   02/02/22 0821  TempSrc: Temporal  PainSc: Asleep         Complications: No notable events documented.

## 2022-02-02 NOTE — H&P (Signed)
Correction- Eliquis held for 1 day (last dose Tuesday evening)  Ronne Binning, [facility]

## 2022-02-02 NOTE — Anesthesia Preprocedure Evaluation (Addendum)
Anesthesia Evaluation  Patient identified by MRN, date of birth, ID band Patient awake    Reviewed: Allergy & Precautions, NPO status , Patient's Chart, lab work & pertinent test results  Airway Mallampati: II  TM Distance: >3 FB Neck ROM: full    Dental  (+) Teeth Intact   Pulmonary neg pulmonary ROS, Patient abstained from smoking., former smoker,  HX of pulmonary emboli   Pulmonary exam normal breath sounds clear to auscultation       Cardiovascular Exercise Tolerance: Good negative cardio ROS Normal cardiovascular exam Rhythm:Regular     Neuro/Psych negative neurological ROS  negative psych ROS   GI/Hepatic negative GI ROS, Neg liver ROS,   Endo/Other  negative endocrine ROSHypothyroidism   Renal/GU negative Renal ROS  negative genitourinary   Musculoskeletal   Abdominal Normal abdominal exam  (+)   Peds negative pediatric ROS (+)  Hematology negative hematology ROS (+)   Anesthesia Other Findings Past Medical History: No date: Complication of anesthesia     Comment:  felt sedation lasted several days in '90's w/knee               surgery No date: DVT (deep venous thrombosis) (HCC) No date: Hypothyroidism No date: Mass in chest No date: Pulmonary emboli [facility])  Past Surgical History: 2001: CHOLECYSTECTOMY 11/09/2016: CHONDROPLASTY; Right     Comment:  Procedure: RIGHT KNEE ARTHROSCOPY, PARTIAL MEDIAL               [place], LATERAL TIBIAL PLATEAU SUBCHONDROPLASTY;                Surgeon: Sydnee Cabal, MD;  Location: [facility];  Service: Orthopedics;  Laterality:               Right; [phone]: KNEE SURGERY; Right     Comment:  meniscus repair first-ACL repair in '98 2011: THYROIDECTOMY  BMI    Body Mass Index: 32.89 kg/m      Reproductive/Obstetrics negative OB ROS                            Anesthesia Physical Anesthesia  Plan  ASA: 2  Anesthesia Plan: General   Post-op Pain Management:    Induction: Intravenous  PONV Risk Score and Plan: Propofol infusion and TIVA  Airway Management Planned: Natural Airway  Additional Equipment:   Intra-op Plan:   Post-operative Plan:   Informed Consent: I have reviewed the patients History and Physical, chart, labs and discussed the procedure including the risks, benefits and alternatives for the proposed anesthesia with the patient or authorized representative who has indicated his/her understanding and acceptance.     Dental Advisory Given  Plan Discussed with: CRNA  Anesthesia Plan Comments:         Anesthesia Quick Evaluation

## 2022-02-03 ENCOUNTER — Encounter: Payer: Self-pay | Admitting: Gastroenterology

## 2022-02-03 LAB — SURGICAL PATHOLOGY

## 2022-07-11 ENCOUNTER — Emergency Department: Payer: Medicaid Other

## 2022-07-11 ENCOUNTER — Encounter: Payer: Self-pay | Admitting: Emergency Medicine

## 2022-07-11 ENCOUNTER — Emergency Department
Admission: EM | Admit: 2022-07-11 | Discharge: 2022-07-11 | Disposition: A | Discharge status: Home / Self Care | Payer: Medicaid Other | Attending: Emergency Medicine | Admitting: Emergency Medicine

## 2022-07-11 DIAGNOSIS — I4891 Unspecified atrial fibrillation: Secondary | ICD-10-CM | POA: Insufficient documentation

## 2022-07-11 DIAGNOSIS — E0590 Thyrotoxicosis, unspecified without thyrotoxic crisis or storm: Secondary | ICD-10-CM

## 2022-07-11 DIAGNOSIS — Z7901 Long term (current) use of anticoagulants: Secondary | ICD-10-CM | POA: Insufficient documentation

## 2022-07-11 DIAGNOSIS — R002 Palpitations: Secondary | ICD-10-CM | POA: Diagnosis present

## 2022-07-11 LAB — CBC
HCT: 42.8 % (ref 36.0–46.0)
Hemoglobin: 14.2 g/dL (ref 12.0–15.0)
MCH: 29.3 pg (ref 26.0–34.0)
MCHC: 33.2 g/dL (ref 30.0–36.0)
MCV: 88.2 fL (ref 80.0–100.0)
Platelets: 300 10*3/uL (ref 150–400)
RBC: 4.85 MIL/uL (ref 3.87–5.11)
RDW: 11.8 % (ref 11.5–15.5)
WBC: 7.3 10*3/uL (ref 4.0–10.5)
nRBC: 0 % (ref 0.0–0.2)

## 2022-07-11 LAB — BASIC METABOLIC PANEL
Anion gap: 14 (ref 5–15)
BUN: 15 mg/dL (ref 6–20)
CO2: 19 mmol/L — ABNORMAL LOW (ref 22–32)
Calcium: 8.7 mg/dL — ABNORMAL LOW (ref 8.9–10.3)
Chloride: 105 mmol/L (ref 98–111)
Creatinine, Ser: 1.04 mg/dL — ABNORMAL HIGH (ref 0.44–1.00)
GFR, Estimated: >60 mL/min (ref >60)
Glucose, Bld: 160 mg/dL — ABNORMAL HIGH (ref 70–99)
Potassium: 3.7 mmol/L (ref 3.5–5.1)
Sodium: 138 mmol/L (ref 135–145)

## 2022-07-11 LAB — URINALYSIS, ROUTINE W REFLEX MICROSCOPIC
Bilirubin Urine: NEGATIVE
Glucose, UA: NEGATIVE mg/dL
Hgb urine dipstick: NEGATIVE
Ketones, ur: NEGATIVE mg/dL
Leukocytes,Ua: NEGATIVE
Nitrite: NEGATIVE
Protein, ur: NEGATIVE mg/dL
Specific Gravity, Urine: 1.014 (ref 1.005–1.030)
pH: 6 (ref 5.0–8.0)

## 2022-07-11 LAB — PROTIME-INR
INR: 1.3 — ABNORMAL HIGH (ref 0.8–1.2)
Prothrombin Time: 16.2 seconds — ABNORMAL HIGH (ref 11.4–15.2)

## 2022-07-11 LAB — HCG, QUANTITATIVE, PREGNANCY: hCG, Beta Chain, Quant, S: 1 m[IU]/mL (ref <5)

## 2022-07-11 LAB — TSH: TSH: 0.17 u[IU]/mL — ABNORMAL LOW (ref 0.350–4.500)

## 2022-07-11 LAB — MAGNESIUM: Magnesium: 1.9 mg/dL (ref 1.7–2.4)

## 2022-07-11 LAB — T4, FREE: Free T4: 1.47 ng/dL — ABNORMAL HIGH (ref 0.61–1.12)

## 2022-07-11 LAB — TROPONIN I (HIGH SENSITIVITY)
Troponin I (High Sensitivity): 5 ng/L (ref <18)
Troponin I (High Sensitivity): 4 ng/L (ref <18)

## 2022-07-11 LAB — POC URINE PREG, ED: Preg Test, Ur: NEGATIVE

## 2022-07-11 MED ORDER — DILTIAZEM HCL 25 MG/5ML IV SOLN
10.0000 mg | Freq: Once | INTRAVENOUS | Status: AC
  Administered 2022-07-11: 10 mg via INTRAVENOUS
  Filled 2022-07-11: qty 5

## 2022-07-11 MED ORDER — DILTIAZEM HCL-DEXTROSE 125-5 MG/125ML-% IV SOLN (PREMIX): 5.0000 mg/h | INTRAVENOUS | Status: DC

## 2022-07-11 MED ORDER — METOPROLOL TARTRATE 25 MG PO TABS: 25.0000 mg | ORAL_TABLET | ORAL | 0 refills | Status: AC | PRN

## 2022-07-11 NOTE — ED Provider Notes (Signed)
[facility] Provider Note    Event Date/Time   First MD Initiated Contact with Patient 07/11/22 [phone]     (approximate)   History   Chief Complaint Palpitations   HPI  Penny [name] is a 47 y.o. female with past medical history of DVT/PE on Eliquis and hypothyroidism who presents to the ED complaining palpitations.  Patient reports that for about the past week she has been dealing with intermittent sensation that her heart is racing.  She states that she can typically get it to go away with rest and relaxation after about 30 minutes.  However, symptoms have become persistent since last night and patient states that she was unable to sleep the entire night due to sensation of her heart racing.  She reports occasional tightness in her chest but denies any chest pain currently.  She reports some mild difficulty breathing but has not had any fevers or cough.  She denies any nausea, vomiting, diarrhea, or dysuria.  She denies any history of irregular heartbeat.     Physical Exam   Triage Vital Signs: ED Triage Vitals  Enc Vitals Group     BP 07/11/22 0833 125/82     Pulse Rate 07/11/22 0833 (!) 158     Resp 07/11/22 0833 20     Temp 07/11/22 0833 98.1 F (36.7 C)     Temp Source 07/11/22 0833 Oral     SpO2 07/11/22 0833 96 %     Weight 07/11/22 0835 210 lb (95.3 kg)     Height 07/11/22 0835 '5\' 7"'$  (1.702 m)     Head Circumference --      Peak Flow --      Pain Score 07/11/22 0831 0     Pain Loc --      Pain Edu? --      Excl. in [place]? --     Most recent vital signs: Vitals:   07/11/22 1230 07/11/22 1300  BP: 106/69 102/75  Pulse: (!) 57 65  Resp: 15 15  Temp:    SpO2: 99% 98%    Constitutional: Alert and oriented. Eyes: Conjunctivae are normal. Head: Atraumatic. Nose: No congestion/rhinnorhea. Mouth/Throat: Mucous membranes are moist.  Cardiovascular: Tachycardic, irregularly irregular rhythm. Grossly normal heart sounds.  2+ radial pulses  bilaterally. Respiratory: Normal respiratory effort.  No retractions. Lungs CTAB. Gastrointestinal: Soft and nontender. No distention. Musculoskeletal: No lower extremity tenderness nor edema.  Neurologic:  Normal speech and language. No gross focal neurologic deficits are appreciated.    ED Results / Procedures / Treatments   Labs (all labs ordered are listed, but only abnormal results are displayed) Labs Reviewed  BASIC METABOLIC PANEL - Abnormal; Notable for the following components:      Result Value   CO2 19 (*)    Glucose, Bld 160 (*)    Creatinine, Ser 1.04 (*)    Calcium 8.7 (*)    All other components within normal limits  PROTIME-INR - Abnormal; Notable for the following components:   Prothrombin Time 16.2 (*)    INR 1.3 (*)    All other components within normal limits  TSH - Abnormal; Notable for the following components:   TSH 0.170 (*)    All other components within normal limits  URINALYSIS, ROUTINE W REFLEX MICROSCOPIC - Abnormal; Notable for the following components:   Color, Urine YELLOW (*)    APPearance HAZY (*)    All other components within normal limits  T4, FREE -  Abnormal; Notable for the following components:   Free T4 1.47 (*)    All other components within normal limits  CBC  MAGNESIUM  HCG, QUANTITATIVE, PREGNANCY  POC URINE PREG, ED  TROPONIN I (HIGH SENSITIVITY)  TROPONIN I (HIGH SENSITIVITY)     EKG  ED ECG REPORT I, Blake Divine, the attending physician, personally viewed and interpreted this ECG.   Date: 07/11/2022  EKG Time: 8:37  Rate: 166  Rhythm: atrial fibrillation  Axis: Normal  Intervals:none  ST&T Change: ST elevation in aVR with diffuse ST depressions, likely rate related  ED ECG REPORT I, Blake Divine, the attending physician, personally viewed and interpreted this ECG.   Date: 07/11/2022  EKG Time: 10:50  Rate: 57  Rhythm: normal sinus rhythm  Axis: Normal  Intervals:none  ST&T Change:  None   RADIOLOGY Chest x-ray reviewed and interpreted by me with no infiltrate, edema, or effusion.  PROCEDURES:  Critical Care performed: Yes, see critical care procedure note(s)  .Critical Care  Performed by: Blake Divine, MD Authorized by: Blake Divine, MD   Critical care provider statement:    Critical care time (minutes):  30   Critical care time was exclusive of:  Separately billable procedures and treating other patients and teaching time   Critical care was necessary to treat or prevent imminent or life-threatening deterioration of the following conditions:  Cardiac failure   Critical care was time spent personally by me on the following activities:  Development of treatment plan with patient or surrogate, discussions with consultants, evaluation of patient's response to treatment, examination of patient, ordering and review of laboratory studies, ordering and review of radiographic studies, ordering and performing treatments and interventions, pulse oximetry, re-evaluation of patient's condition and review of old charts   I assumed direction of critical care for this patient from another provider in my specialty: no     Care discussed with: admitting provider      MEDICATIONS ORDERED IN ED: Medications  diltiazem (CARDIZEM) 125 mg in dextrose 5% 125 mL (1 mg/mL) infusion (has no administration in time range)  diltiazem (CARDIZEM) injection 10 mg (10 mg Intravenous Given 07/11/22 0911)     IMPRESSION / MDM / [place] / ED COURSE  I reviewed the triage vital signs and the nursing notes.                              47 y.o. female with past medical history of DVT/PE on Eliquis and hypothyroidism who presents to the ED complaining of intermittent palpitations for the past week, persistent since last night.  Patient's presentation is most consistent with acute presentation with potential threat to life or bodily function.  Differential diagnosis includes,  but is not limited to, arrhythmia, ACS, PE, pneumonia, pneumothorax, electrolyte abnormality, hyperthyroidism, UTI.  Patient nontoxic-appearing and in no acute distress, vital signs remarkable for tachycardia but otherwise reassuring with stable blood pressure.  EKG shows atrial fibrillation with RVR, rates noted to be as high as the 160s.  We will attempt to control heart rate with IV diltiazem bolus and drip, while patient is anticoagulated she does not have a history of atrial fibrillation.  Plan to check electrolytes and thyroid function for precipitating cause, no symptoms to suggest infectious process but will check chest x-ray and urinalysis.  Patient had significant improvement in heart rate following dose of IV diltiazem, did not require initiation of continuous drip and eventually converted  to normal sinus rhythm.  Follow-up EKG shows sinus bradycardia with no ischemic changes and patient's symptoms have resolved following conversion.  Labs are reassuring with no electrolyte abnormality or AKI, no significant anemia or leukocytosis noted.  2 sets of troponin are within normal limits.  She does have low TSH and elevated free T4, states her endocrinologist recently dropped her levothyroxine dose within the past week.  TSH seems to be improving compared to her recent labs and we will hold off on changes to her levothyroxine dosing for now.  Case discussed with Dr. Clayborn Bigness of cardiology, who recommends as needed dosing of metoprolol given patient's low normal blood pressure and heart rate following conversion to sinus rhythm.  Patient counseled to follow-up with her endocrinologist regarding thyroid function as well as her cardiologist at [facility].  She was counseled to return to the ED for new or worsening symptoms, patient agrees to plan.      FINAL CLINICAL IMPRESSION(S) / ED DIAGNOSES   Final diagnoses:  Atrial fibrillation with RVR ([place])  Hyperthyroidism     Rx / DC Orders   ED Discharge  Orders          Ordered    metoprolol tartrate (LOPRESSOR) 25 MG tablet  As needed        07/11/22 1338             Note:  This document was prepared using Dragon voice recognition software and may include unintentional dictation errors.   Blake Divine, MD 07/11/22 1340

## 2022-07-11 NOTE — ED Triage Notes (Signed)
Pt reports heart fluttering for a week. States her apple watch told her she is in Afib. Denies CP. No hx of Afib. Pt on eliquis for previous PE and DVT.

## 2022-07-16 ENCOUNTER — Other Ambulatory Visit: Payer: Self-pay

## 2022-07-16 ENCOUNTER — Emergency Department: Payer: Medicaid Other

## 2022-07-16 ENCOUNTER — Emergency Department
Admission: EM | Admit: 2022-07-16 | Discharge: 2022-07-16 | Disposition: A | Discharge status: Home / Self Care | Payer: Medicaid Other | Attending: Emergency Medicine | Admitting: Emergency Medicine

## 2022-07-16 ENCOUNTER — Encounter: Payer: Self-pay | Admitting: Emergency Medicine

## 2022-07-16 DIAGNOSIS — I4891 Unspecified atrial fibrillation: Secondary | ICD-10-CM | POA: Diagnosis not present

## 2022-07-16 DIAGNOSIS — R002 Palpitations: Secondary | ICD-10-CM | POA: Diagnosis present

## 2022-07-16 LAB — CBC
HCT: 42.9 % (ref 36.0–46.0)
Hemoglobin: 14.7 g/dL (ref 12.0–15.0)
MCH: 30 pg (ref 26.0–34.0)
MCHC: 34.3 g/dL (ref 30.0–36.0)
MCV: 87.6 fL (ref 80.0–100.0)
Platelets: 344 10*3/uL (ref 150–400)
RBC: 4.9 MIL/uL (ref 3.87–5.11)
RDW: 11.8 % (ref 11.5–15.5)
WBC: 8.8 10*3/uL (ref 4.0–10.5)
nRBC: 0 % (ref 0.0–0.2)

## 2022-07-16 LAB — BASIC METABOLIC PANEL
Anion gap: 11 (ref 5–15)
BUN: 13 mg/dL (ref 6–20)
CO2: 23 mmol/L (ref 22–32)
Calcium: 8.6 mg/dL — ABNORMAL LOW (ref 8.9–10.3)
Chloride: 105 mmol/L (ref 98–111)
Creatinine, Ser: 0.98 mg/dL (ref 0.44–1.00)
GFR, Estimated: >60 mL/min (ref >60)
Glucose, Bld: 170 mg/dL — ABNORMAL HIGH (ref 70–99)
Potassium: 3.3 mmol/L — ABNORMAL LOW (ref 3.5–5.1)
Sodium: 139 mmol/L (ref 135–145)

## 2022-07-16 LAB — TROPONIN I (HIGH SENSITIVITY): Troponin I (High Sensitivity): 6 ng/L (ref <18)

## 2022-07-16 LAB — POC URINE PREG, ED: Preg Test, Ur: NEGATIVE

## 2022-07-16 MED ORDER — POTASSIUM CHLORIDE CRYS ER 20 MEQ PO TBCR
20.0000 meq | EXTENDED_RELEASE_TABLET | Freq: Once | ORAL | Status: AC
  Administered 2022-07-16: 20 meq via ORAL
  Filled 2022-07-16: qty 1

## 2022-07-16 NOTE — ED Provider Notes (Signed)
[facility] Provider Note    Event Date/Time   First MD Initiated Contact with Patient 07/16/22 2305     (approximate)   History   Palpitations   HPI  Penny [name] is a 47 y.o. female who presents to the emergency department today because of concerns for atrial fibrillation and fast heart rate.  The patient was first diagnosed with atrial fibrillation 5 days ago.  Did convert back to normal sinus rhythm with diltiazem.  She is since been seen 1 other time in emergency department for atrial fibrillation which again converted back to normal sinus rhythm.  She states that it seems to occur when she lies down at night.  Episode today lasted roughly 3 hours.  She did take fast acting diltiazem that was prescribed to her by outpatient provider.  At the time my exam she states she is feeling much better.     Physical Exam   Triage Vital Signs: ED Triage Vitals  Enc Vitals Group     BP 07/16/22 2143 (!) 132/90     Pulse Rate 07/16/22 2143 96     Resp 07/16/22 2143 18     Temp 07/16/22 2143 98.1 F (36.7 C)     Temp Source 07/16/22 2143 Oral     SpO2 07/16/22 2143 98 %     Weight 07/16/22 2140 209 lb 14.1 oz (95.2 kg)     Height 07/16/22 2140 '5\' 7"'$  (1.702 m)     Head Circumference --      Peak Flow --      Pain Score 07/16/22 2146 2     Pain Loc --      Pain Edu? --      Excl. in [place]? --     Most recent vital signs: Vitals:   07/16/22 2245 07/16/22 2300  BP:  104/68  Pulse: 65 63  Resp:  17  Temp:    SpO2:  97%   General: Awake, alert, oriented. CV:  Good peripheral perfusion. Regular rate and rhythm. Resp:  Normal effort. Lungs clear. Abd:  No distention.     ED Results / Procedures / Treatments   Labs (all labs ordered are listed, but only abnormal results are displayed) Labs Reviewed  BASIC METABOLIC PANEL - Abnormal; Notable for the following components:      Result Value   Potassium 3.3 (*)    Glucose, Bld 170 (*)    Calcium  8.6 (*)    All other components within normal limits  CBC  POC URINE PREG, ED  TROPONIN I (HIGH SENSITIVITY)     EKG  I, Nance Pear, attending physician, personally viewed and interpreted this EKG  EKG Time: 2141 Rate: 139 Rhythm: atrial fibrillation with RVR Axis: normal Intervals: qtc 456 QRS: narrow ST changes: no st elevation Impression: abnormal ekg  I, Nance Pear, attending physician, personally viewed and interpreted this EKG  EKG Time: 2323 Rate: 66 Rhythm: sinus rhythm Axis: normal Intervals: qtc 438 QRS: narrow ST changes: no st elevation Impression: normal ekg   RADIOLOGY I independently interpreted and visualized the CXR. My interpretation: No pneumonia Radiology interpretation:  IMPRESSION:  No active cardiopulmonary disease.     PROCEDURES:  Critical Care performed: No  Procedures   MEDICATIONS ORDERED IN ED: Medications - No data to display   IMPRESSION / MDM / [place] / ED COURSE  I reviewed the triage vital signs and the nursing notes.  Differential diagnosis includes, but is not limited to, atrial fibrillation, sinus tachycardia, SVT  Patient's presentation is most consistent with acute presentation with potential threat to life or bodily function.  The patient is on the cardiac monitor to evaluate for evidence of arrhythmia and/or significant heart rate changes.  Patient presented to the emergency department today because of concerns for recurrent paroxysmal atrial fibrillation.  Initial EKG is consistent with atrial fibrillation with RVR.  However while she was here in the emergency department she converted back to normal sinus rhythm.  Blood work without any significant abnormalities although slightly hypokalemic.  Given that patient has converted back to normal sinus rhythm do not feel any further emergent management is necessary at this time.  Will plan on discharging to continue  follow-up with outpatient providers.     FINAL CLINICAL IMPRESSION(S) / ED DIAGNOSES   Final diagnoses:  Atrial fibrillation with RVR ([place])     Note:  This document was prepared using Dragon voice recognition software and may include unintentional dictation errors.    Nance Pear, MD 07/16/22 [phone]

## 2022-07-16 NOTE — Discharge Instructions (Signed)
Please seek medical attention for any high fevers, chest pain, shortness of breath, change in behavior, persistent vomiting, bloody stool or any other new or concerning symptoms.  

## 2022-07-16 NOTE — ED Triage Notes (Signed)
Pt presents ambulatory to triage via POV with complaints of "Afib". She notes being diagnosed on 07/12/21 - takes Cardizem daily and was able to convert back while in the ED. Rates the discomfort 2/10. A&Ox4 at this time. Denies dizziness, N/V/D, or SOB.

## 2022-11-27 ENCOUNTER — Other Ambulatory Visit: Payer: Self-pay | Admitting: Internal Medicine

## 2022-11-27 ENCOUNTER — Ambulatory Visit
Admission: RE | Admit: 2022-11-27 | Discharge: 2022-11-27 | Disposition: A | Discharge status: Home / Self Care | Payer: Medicaid Other | Source: Ambulatory Visit | Attending: Internal Medicine | Admitting: Internal Medicine

## 2022-11-27 DIAGNOSIS — R519 Headache, unspecified: Secondary | ICD-10-CM | POA: Diagnosis present

## 2022-11-28 ENCOUNTER — Encounter: Payer: Self-pay | Admitting: Internal Medicine

## 2022-11-28 DIAGNOSIS — R519 Headache, unspecified: Secondary | ICD-10-CM

## 2023-01-10 HISTORY — PX: COMBINED HYSTEROSCOPY DIAGNOSTIC / D&C: SUR297

## 2023-02-07 ENCOUNTER — Inpatient Hospital Stay: Payer: Medicaid Other | Attending: Oncology | Admitting: Obstetrics and Gynecology

## 2023-02-07 ENCOUNTER — Inpatient Hospital Stay: Payer: Medicaid Other

## 2023-02-07 VITALS — BP 130/80 | HR 62 | Temp 97.6°F | Resp 20 | Wt 214.0 lb

## 2023-02-07 DIAGNOSIS — Z8041 Family history of malignant neoplasm of ovary: Secondary | ICD-10-CM | POA: Diagnosis not present

## 2023-02-07 DIAGNOSIS — R87619 Unspecified abnormal cytological findings in specimens from cervix uteri: Secondary | ICD-10-CM | POA: Diagnosis not present

## 2023-02-07 DIAGNOSIS — Z8049 Family history of malignant neoplasm of other genital organs: Secondary | ICD-10-CM | POA: Diagnosis not present

## 2023-02-07 NOTE — Progress Notes (Signed)
Gynecologic Oncology Consult Visit   Referring Provider: Conard Novak, MD [address] 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   Subjective:  Penny [name] is a 47 y.o. [phone] female who is seen in consultation from Dr. 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   She has been previously seen by Dr. Carney Living and the team at the [facility] Gynecologic clinic.   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 Notable cervical stenosis on in office exam. Given h/o uterine ablation and unable to obtain adequate EMB in office, recommended OR HSC D&C.   12/15/2022 US PELVIC  Findings:  UTERUS:  The uterus is anteverted. It measures 7.9 x 3.8 x 5.1 cm. Multiple nabothian cysts present. The endometrium is homogeneous and measures 0.4 cm.  RIGHT ADNEXA: The right ovary appears normal. The right ovary measures 2.3 x 1.8 x 1.1 cm.  Arterial and venous waveforms are documented in the right ovary. Normal-appearing cyst/follicle. LEFT ADNEXA: The left ovary appears normal. The left ovary measures 2.8 x 1.8 x 1.9 cm. Arterial and venous waveforms are documented in the left ovary. Dominant follicle present. Impression: Normal endometrial thickness. No significant abnormality on pelvic ultrasound.  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  Final pathology demonstrated Scant benign smooth muscle with scar tissue, consistent with history of endometrial ablation. Endometrial epithelium is absent.   Dr. Carney Living discussed the AGUS pap can be from the cervix, the endometrium, and in rare cases fallopian tube/ovarian origin. Given her family history of ovarian CA in mother, and aggressive uterine cancer in her maternal aunt, would lean towards hysterectomy-BSO. She referred her to Dr. Briscoe Deutscher for  further evaluation.   She has also seen Dr. Jean Rosenthal as she would like to have her care locally. He referred her to our clinic to discuss management of AGC, NOS given her work up.   She has a significant family h/o of gynecologic cancers. Her mother was diagnosed in her 27s in 2007. She saw Dr. Clifton James and underwent debulking surgery and chemotherapy.  She had genetic testing that was negative.  She is still alive and doing well.  She also has a maternal aunt who was diagnosed with uterine cancer in her 25s.  She is also still alive.  Her paternal aunt was diagnosed with uterine cancer in her 57s had a recurrence and is subsequently deceased.  Her medical committed abilities include A-fib status post ablation in May 2024 and she is scheduled to have another ablation in October.  She has chronic kidney disease based on elevated creatinine.  She does have a history of bladder and kidney infections.  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  She is on chronic anticoagulation with Eliquis.  She has had a thrombophilia workup per her report which she says was negative  Problem List: Patient Active Problem List   Diagnosis Date Noted   S/P arthroscopy of right knee 11/09/2016   Hypothyroidism 09/13/2016    Past Medical History: Past Medical History:  Diagnosis Date   Atrial fibrillation (HCC)    s/p ablation 09/2022; scheduled for repeat ablation 02/2023   Azotemia    elevated creatinine 1.1-1.2   Complication of anesthesia    felt sedation lasted several days in '90's w/knee surgery  DVT (deep venous thrombosis) (HCC)    History of UTI    h/o bladder and kidney infections   Hypothyroidism    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    stable per patient and no further follow up recommended   Pulmonary emboli (HCC)    Urge incontinence of urine    h/o stress incontinence as well s/p sling x 2    Past Surgical History: Past Surgical History:  Procedure Laterality Date    CHOLECYSTECTOMY  2001   CHONDROPLASTY Right 11/09/2016   Procedure: RIGHT KNEE ARTHROSCOPY, PARTIAL MEDIAL MENISCECOMTY, LATERAL TIBIAL PLATEAU SUBCHONDROPLASTY;  Surgeon: Eugenia Mcalpine, MD;  Location: [facility];  Service: Orthopedics;  Laterality: Right;   COLONOSCOPY N/A 02/02/2022   Procedure: COLONOSCOPY;  Surgeon: Jaynie Collins, DO;  Location: [facility];  Service: Gastroenterology;  Laterality: N/A;   COMBINED HYSTEROSCOPY DIAGNOSTIC / D&C  01/10/2023   KNEE SURGERY Right [id number]   meniscus repair first-ACL repair in '98   REMOVAL OF URINARY SLING     REVISION URINARY SLING     THYROIDECTOMY  2011   URETHRAL SLING      Past Gynecologic History:  Menarche: 13 Menstrual details:h/o abnormal bleeding s/p D&C with Novasure ablation 03/2021 History of Abnormal pap: yes, as HPI   OB History:  OB History  Gravida Para Term Preterm AB Living  4       1 3   SAB IAB Ectopic Multiple Live Births  1       3    # Outcome Date GA Lbr Len/2nd Weight Sex Type Anes PTL Lv  4 Gravida           3 Gravida           2 Gravida           1 SAB             Obstetric Comments  SVDs x 3    Family History: Family History  Problem Relation Age of Onset   Cancer Mother        ovarian cancer in her 34s   Cancer Maternal Aunt 70 - 79       uterine cancer   Cancer Paternal Aunt 4 - 29       uterine cancer subsequent recurrence and death    Social History: Social History   Socioeconomic History   Marital status: Divorced    Spouse name: Not on file   Number of children: 3   Years of education: Not on file   Highest education level: Not on file  Occupational History   Not on file  Tobacco Use   Smoking status: Former    Current packs/day: 0.50    Types: Cigarettes   Smokeless tobacco: Never  Vaping Use   Vaping status: Never Used  Substance and Sexual Activity   Alcohol use: No   Drug use: Never   Sexual activity: Not Currently  Other Topics  Concern   Not on file  Social History Narrative   Not on file   Social Determinants of Health   Financial Resource Strain: Not on file  Food Insecurity: No Food Insecurity (01/18/2023)   Received from [facility]   Hunger Vital Sign    Worried About Running Out of Food in the Last Year: Never true    Ran Out of Food in the Last Year: Never true  Transportation Needs: No Transportation Needs (01/18/2023)   Received from [facility]   PRAPARE - Transportation    In the past 12 months, has lack of transportation kept you from medical appointments or from getting medications?: No    Lack of Transportation (Non-Medical): No  Physical Activity: Not on file  Stress: Not on file  Social Connections: Not on file  Intimate Partner Violence: Not on file    Allergies: Allergies  Allergen Reactions   Codeine Shortness Of Breath    Nausea,dizziness when took a dose by mistake-no breathing problem at that time   Prednisone     Hot flashes/flushing   Latex Rash   Sulfa Antibiotics Rash    Current Medications: Current Outpatient Medications  Medication Sig Dispense Refill   apixaban (ELIQUIS) 5 MG TABS tablet Take 5 mg by mouth 2 (two) times daily.     diltiazem (CARDIZEM) 30 MG tablet TAKE 1 TABLET BY MOUTH 4 TIMES DAILY AS NEEDED (RESTING HR > 110BPM)     fenofibrate (TRICOR) 145 MG tablet Take 1 tablet by mouth at bedtime.     flecainide (TAMBOCOR) 50 MG tablet Take 100 mg by mouth 2 (two) times daily.     folic acid (FOLVITE) 1 MG tablet Take 1 mg by mouth daily.     levothyroxine (SYNTHROID) 175 MCG tablet Take 175 mcg by mouth daily before breakfast.     tolterodine (DETROL LA) 2 MG 24 hr capsule Take 2 mg by mouth daily.     WEGOVY 0.5 MG/0.5ML SOAJ Inject into the skin.     cyclobenzaprine (FLEXERIL) 5 MG tablet Take 1 tablet (5 mg total) by mouth at bedtime. (Patient not taking: Reported on 02/07/2023) 10 tablet 0   gabapentin (NEURONTIN) 300 MG  capsule Take 300 mg by mouth 3 (three) times daily. (Patient not taking: Reported on 02/07/2023)     ibuprofen (ADVIL,MOTRIN) 600 MG tablet Take 600 mg by mouth every 4 (four) hours as needed.     levothyroxine (SYNTHROID, LEVOTHROID) 300 MCG tablet Take 250 mcg by mouth daily.      metoprolol tartrate (LOPRESSOR) 25 MG tablet Take 1 tablet (25 mg total) by mouth as needed (For palpitations and atrial fibrillation). (Patient not taking: Reported on 02/07/2023) 12 tablet 0   No current facility-administered medications for this visit.    Review of Systems General: negative for fevers, changes in weight or night sweats Skin: negative for changes in moles or sores or rash Eyes: negative for changes in vision HEENT: negative for change in hearing, tinnitus, voice changes Pulmonary: negative for dyspnea, orthopnea, productive cough, wheezing Cardiac: palpitations and pain Gastrointestinal: negative for nausea, vomiting, constipation, diarrhea, hematemesis, hematochezia Genitourinary/Sexual: negative for dysuria, retention, hematuria, incontinence Ob/Gyn:  negative for abnormal bleeding, or pain Musculoskeletal: negative for pain, joint pain, back pain Hematology: negative for easy bruising, abnormal bleeding Neurologic/Psych: negative for headaches, seizures, paralysis, weakness, numbness    Objective:  Physical Examination:  BP 130/80   Pulse 62   Temp 97.6 F (36.4 C)   Resp 20   Wt 214 lb (97.1 kg)   SpO2 100%   BMI 33.52 kg/m    ECOG Performance Status: 0 - Asymptomatic  GENERAL: Patient is a well appearing female in no acute distress HEENT:  PERRL, neck supple with midline trachea.  NODES:  No cervical, supraclavicular, axillary, or inguinal lymphadenopathy palpated.  LUNGS:  Normal respiratory effort ABDOMEN:  Soft, nontender, and nondistended. No ascites or masses.  EXTREMITIES:  No peripheral edema.   NEURO:  Nonfocal. Well oriented.  Appropriate  affect.  Pelvic:  Chaperoned CMA EGBUS: no lesions Cervix: no lesions, nontender, mobile Vagina: no lesions, no discharge or bleeding Uterus: normal size, nontender, mobile Adnexa: no palpable masses   Lab Review N/A  Radiologic Imaging: Reviewed [place] noted in HPI    Assessment:  JAMARIS REICHARDT is a 47 y.o. 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  Evaluation may be limited by prior uterine ablation and scarring of the uterus.  However all pathology is negative and ultrasound is reassuring.  Positive family history of ovarian and uterine cancers,   Medical co-morbidities complicating care:   Atrial fibrillation [facility])    s/p ablation 09/2022; scheduled for repeat ablation 02/2023   Azotemia    elevated creatinine 1.1-1.2   Complication of anesthesia    felt sedation lasted several days in '90's w/knee surgery   DVT (deep venous thrombosis) (HCC)    History of UTI    h/o bladder and kidney infections   Hypothyroidism    Pulmonary emboli (HCC)    Urge incontinence of urine h/o stress incontinence as well s/p sling x 2            Plan:   Problem List Items Addressed This Visit   None Visit Diagnoses     Abnormal cervical Papanicolaou smear, unspecified abnormal pap finding    -  Primary   Family history of ovarian cancer       Family history of uterine cancer           We discussed options for management and I provided ASCCP guidelines for AGC, NOS.  The recommendations are to repeat Pap and HPV in 1 in 2 years.  Any abnormal findings should then be evaluated accordingly.  I am not recommending hysterectomy at this time given her other significant medical comorbidities.  Discussed that her prior NovaSure may limit evaluation of the uterine cavity however the ultrasound is reassuring as well as all the pathology findings.  Close follow-up with repeat Pap smears and surveillance for any abnormal symptoms are important.  With regard to family history I recommended genetic  testing for her given the number of relatives involved.  The panels are much different now and more extensive than they used to be in 2007.  Follow-up these results and determine the next steps accordingly.  The patient's diagnosis, an outline of the further diagnostic and laboratory studies which will be required, the recommendation, and alternatives were discussed.  All questions were answered to the patient's satisfaction.  A total of 80 minutes were spent with the patient/family today; >50% was spent in education, counseling and coordination of care for AGC, NOS and positive family history of ovarian and uterine cancers.  Daylan Boggess Leta Jungling, MD    CC:  Conard Novak, MD [address] [place],  [place] [address] [phone]

## 2023-02-14 ENCOUNTER — Encounter: Payer: Self-pay | Admitting: Licensed Clinical Social Worker

## 2023-02-14 DIAGNOSIS — Z1379 Encounter for other screening for genetic and chromosomal anomalies: Secondary | ICD-10-CM | POA: Insufficient documentation

## 2023-02-15 ENCOUNTER — Telehealth: Payer: Self-pay

## 2023-02-15 NOTE — Telephone Encounter (Signed)
Generic voicemail left to return call. Genetic testing negative. Results mailed to home address.

## 2023-03-16 DIAGNOSIS — Z90722 Acquired absence of ovaries, bilateral: Secondary | ICD-10-CM

## 2023-03-16 HISTORY — DX: Acquired absence of ovaries, bilateral: Z90.722

## 2023-04-24 IMAGING — DX DG FOOT COMPLETE 3+V*R*
3 series · 3 of 3 positions shown · non-contrast
Comparison: None.

CLINICAL DATA: Medial heel pain

EXAM:
RIGHT FOOT COMPLETE - 3+ VIEW

[foot ap]
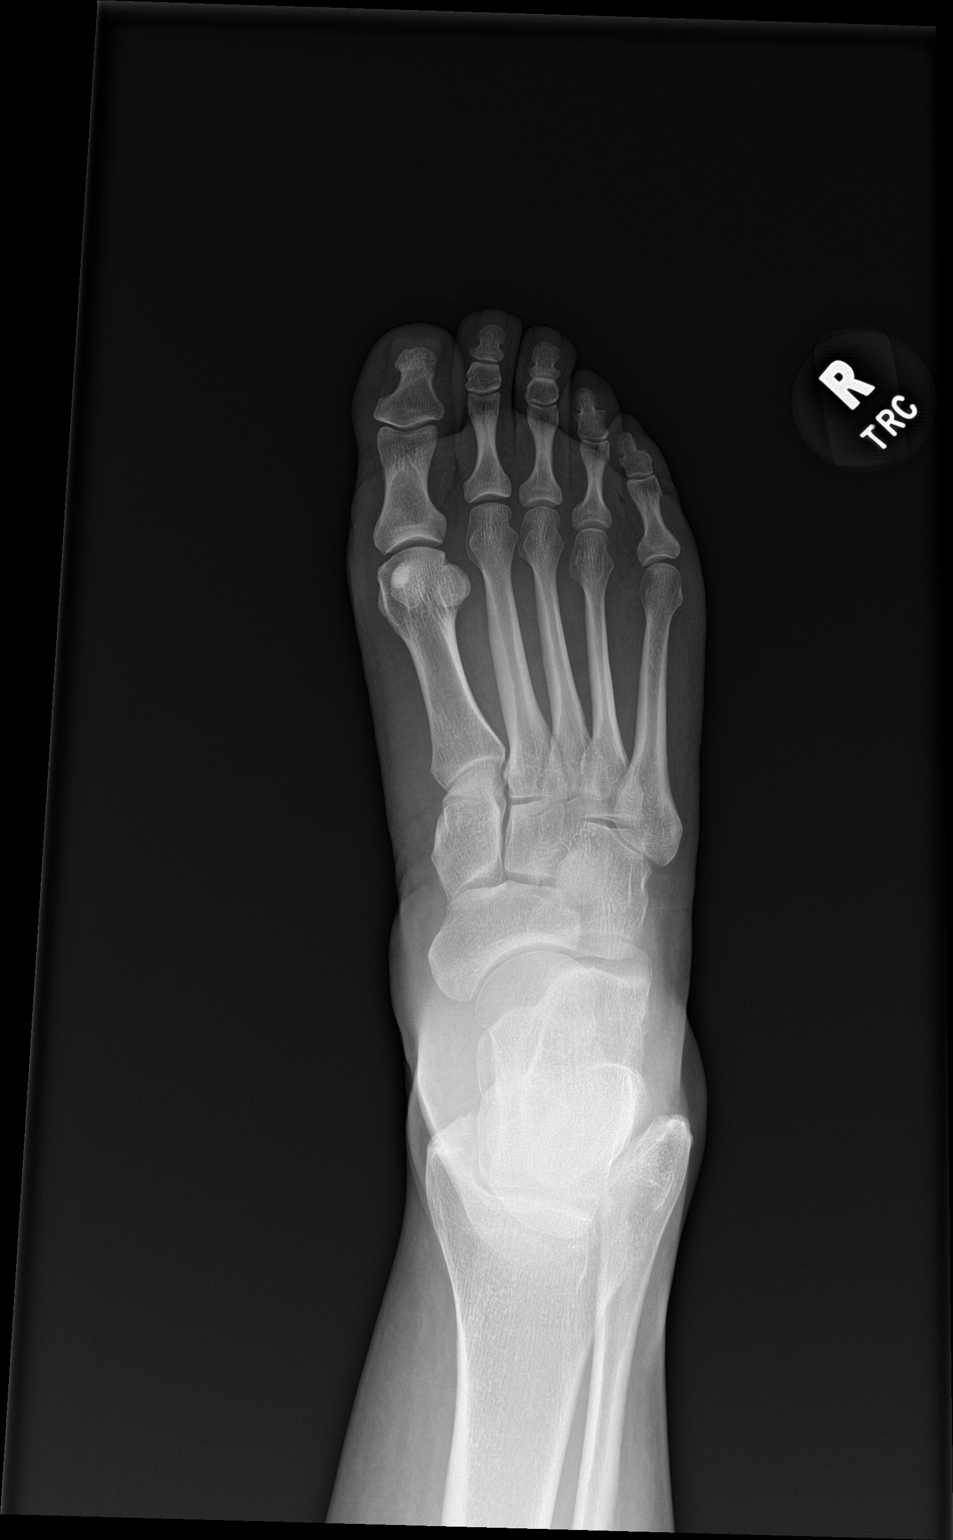

[foot obl]
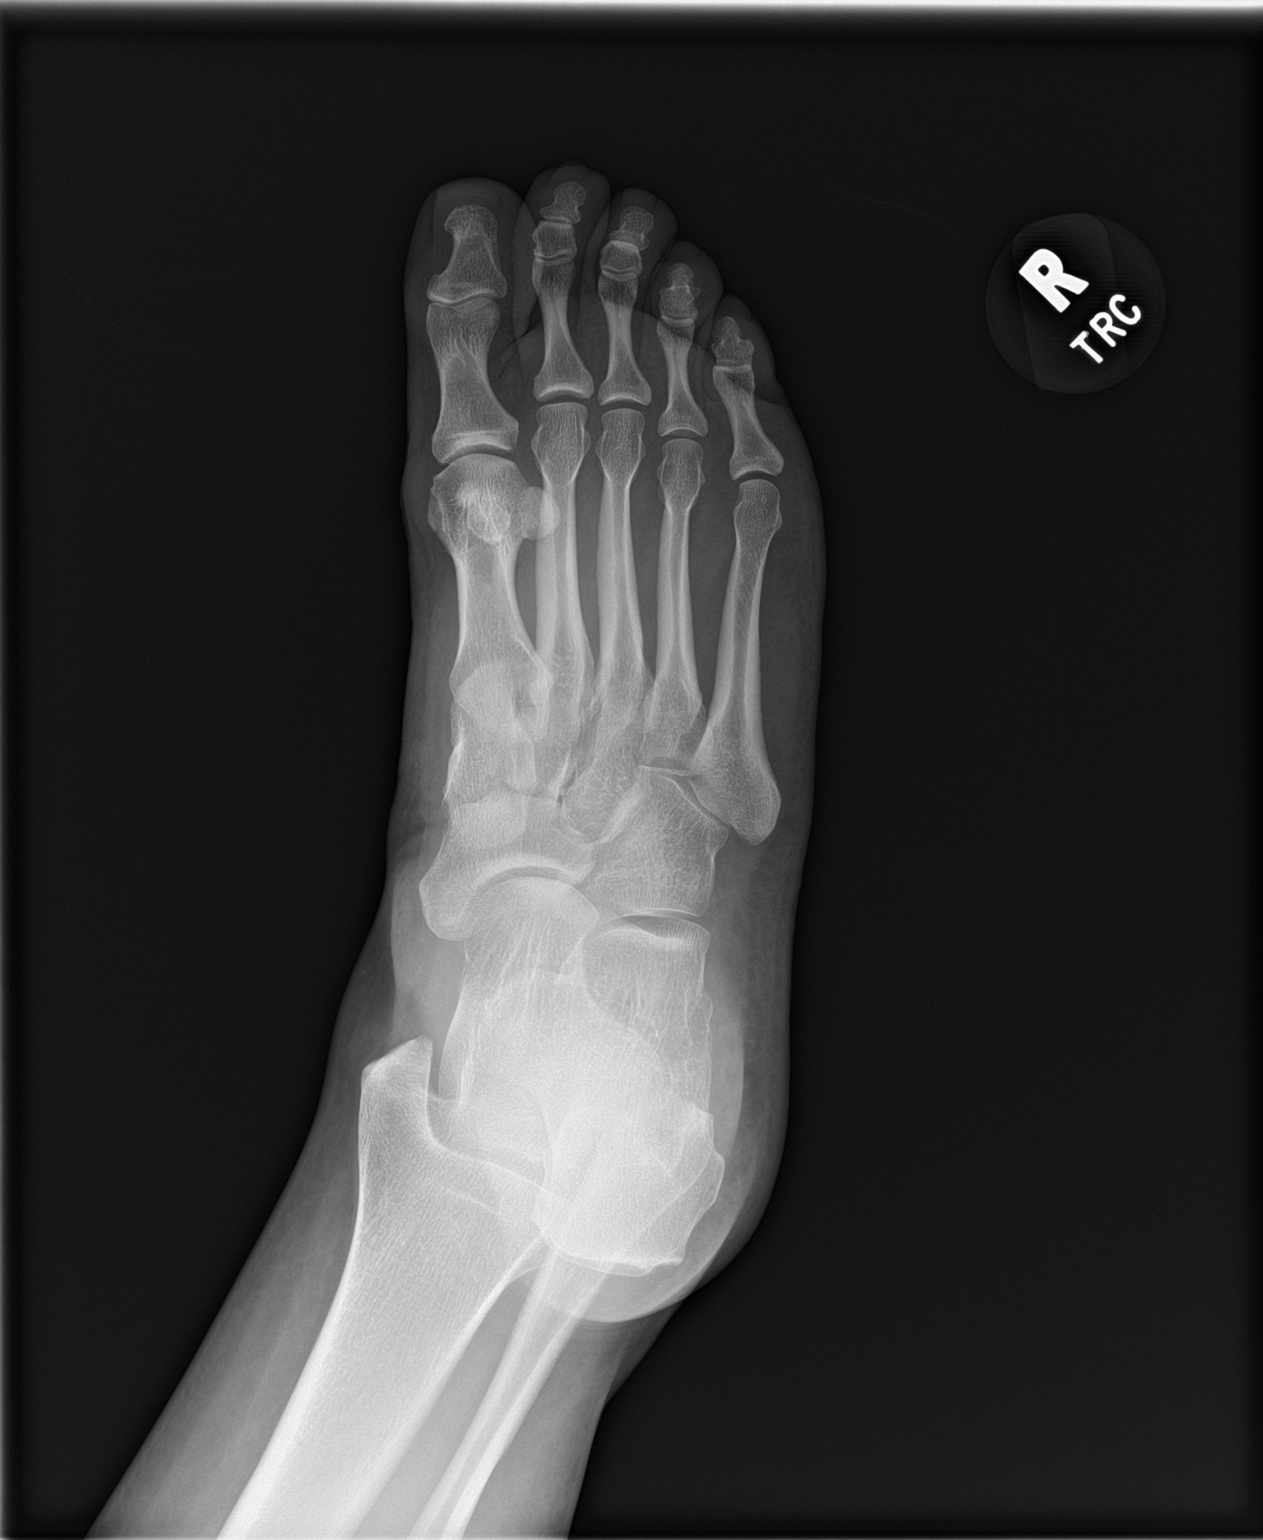

[foot lat]
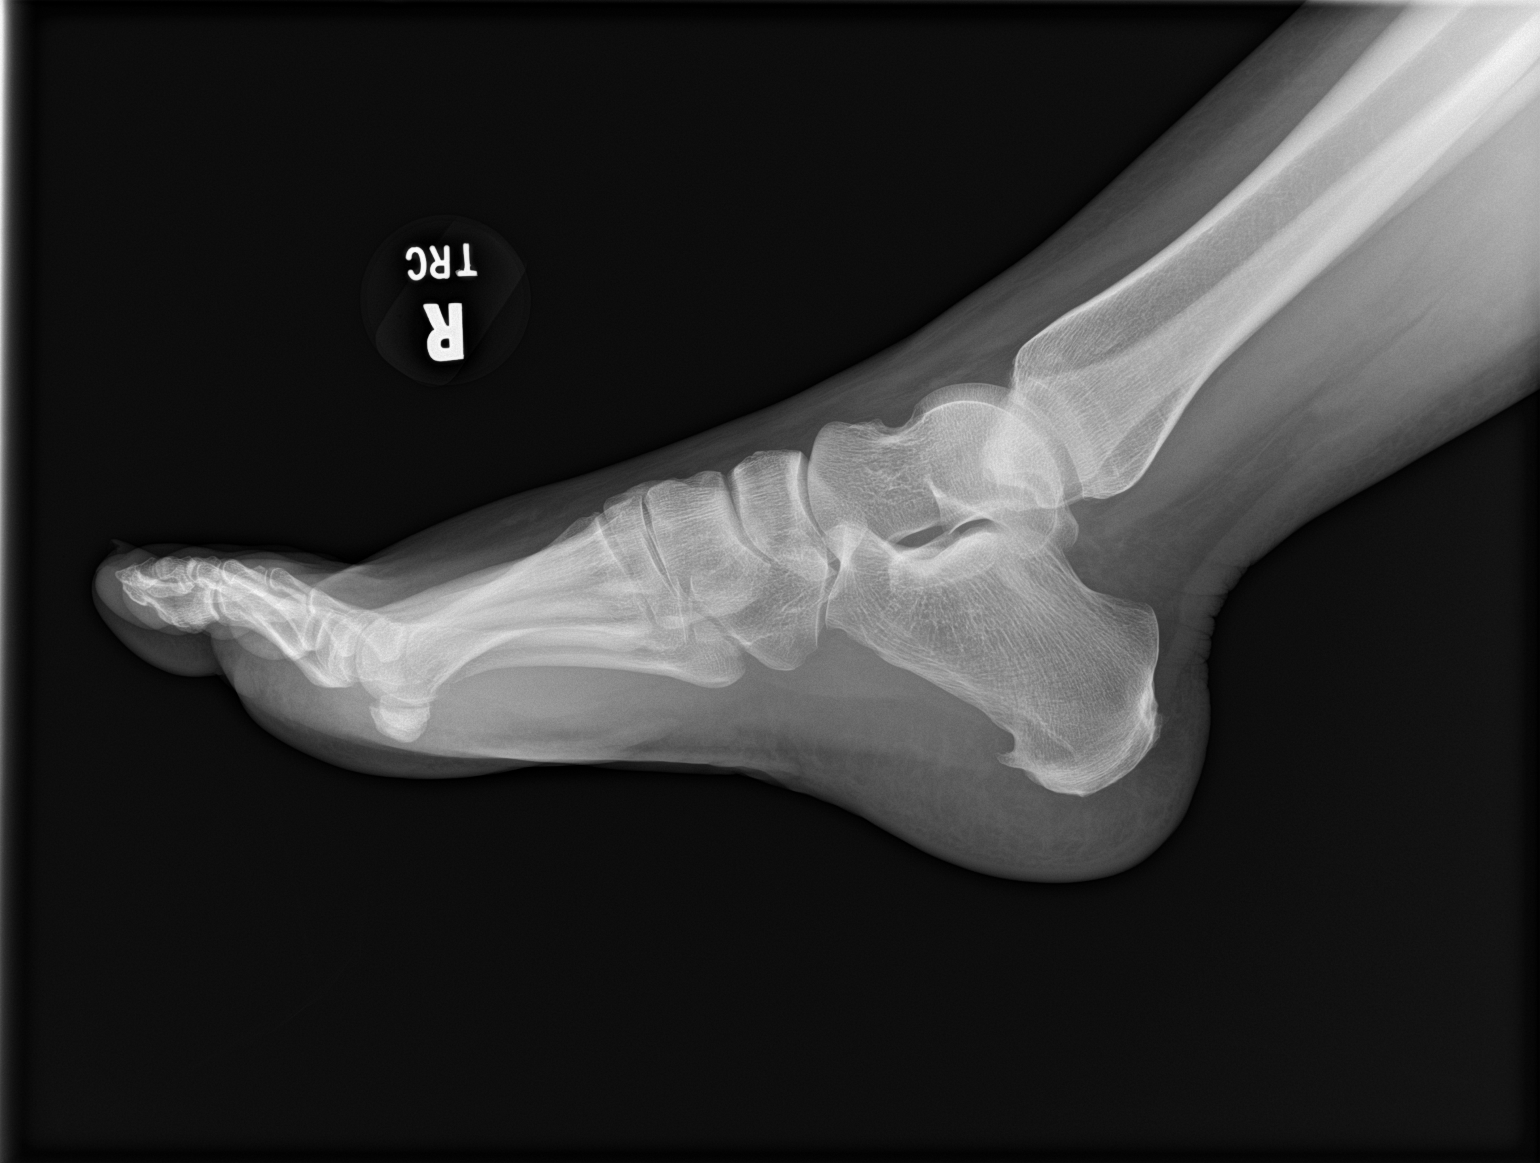

[3 of 3 positions shown; findings below may reference images not displayed]

FINDINGS: Frontal, oblique, and lateral views of the right foot are obtained.
No fracture, subluxation, or dislocation. Small inferior calcaneal
spur. Joint spaces are well preserved. Soft tissues are
unremarkable.
IMPRESSION: 1. Small inferior calcaneal spur. Otherwise unremarkable right foot.

## 2023-10-01 ENCOUNTER — Ambulatory Visit: Admitting: Anesthesiology

## 2023-10-01 ENCOUNTER — Ambulatory Visit
Admission: RE | Admit: 2023-10-01 | Discharge: 2023-10-01 | Disposition: A | Discharge status: Home / Self Care | Attending: Gastroenterology | Admitting: Gastroenterology

## 2023-10-01 ENCOUNTER — Other Ambulatory Visit: Payer: Self-pay

## 2023-10-01 ENCOUNTER — Encounter: Payer: Self-pay | Admitting: Gastroenterology

## 2023-10-01 ENCOUNTER — Encounter
Admission: RE | Disposition: A | Discharge status: Home / Self Care | Payer: Self-pay | Source: Home / Self Care | Attending: Gastroenterology

## 2023-10-01 DIAGNOSIS — Z7901 Long term (current) use of anticoagulants: Secondary | ICD-10-CM | POA: Diagnosis not present

## 2023-10-01 DIAGNOSIS — I4891 Unspecified atrial fibrillation: Secondary | ICD-10-CM | POA: Diagnosis not present

## 2023-10-01 DIAGNOSIS — Z86718 Personal history of other venous thrombosis and embolism: Secondary | ICD-10-CM | POA: Insufficient documentation

## 2023-10-01 DIAGNOSIS — K222 Esophageal obstruction: Secondary | ICD-10-CM | POA: Insufficient documentation

## 2023-10-01 DIAGNOSIS — Z87891 Personal history of nicotine dependence: Secondary | ICD-10-CM | POA: Insufficient documentation

## 2023-10-01 DIAGNOSIS — R1310 Dysphagia, unspecified: Secondary | ICD-10-CM | POA: Diagnosis present

## 2023-10-01 DIAGNOSIS — Z86711 Personal history of pulmonary embolism: Secondary | ICD-10-CM | POA: Diagnosis not present

## 2023-10-01 HISTORY — PX: ESOPHAGOGASTRODUODENOSCOPY: SHX5428

## 2023-10-01 SURGERY — EGD (ESOPHAGOGASTRODUODENOSCOPY)
Anesthesia: General

## 2023-10-01 MED ORDER — PROPOFOL 10 MG/ML IV BOLUS
INTRAVENOUS | Status: AC
  Filled 2023-10-01: qty 20

## 2023-10-01 MED ORDER — PROPOFOL 500 MG/50ML IV EMUL
INTRAVENOUS | Status: DC | PRN
  Administered 2023-10-01: 150 ug/kg/min via INTRAVENOUS

## 2023-10-01 MED ORDER — SODIUM CHLORIDE 0.9 % IV SOLN: INTRAVENOUS | Status: DC

## 2023-10-01 MED ORDER — LIDOCAINE HCL (CARDIAC) PF 100 MG/5ML IV SOSY
PREFILLED_SYRINGE | INTRAVENOUS | Status: DC | PRN
  Administered 2023-10-01: 80 mg via INTRAVENOUS

## 2023-10-01 MED ORDER — PROPOFOL 10 MG/ML IV BOLUS
INTRAVENOUS | Status: DC | PRN
  Administered 2023-10-01: 100 mg via INTRAVENOUS

## 2023-10-01 MED ORDER — LIDOCAINE HCL (PF) 2 % IJ SOLN
INTRAMUSCULAR | Status: AC
  Filled 2023-10-01: qty 5

## 2023-10-01 NOTE — Op Note (Addendum)
 [facility] Gastroenterology Patient Name: Penny [name] Procedure Date: 10/01/2023 1:28 PM MRN: [id number] Account #: [id number] Date of Birth: 26-Feb-1976 Admit Type: Outpatient Age: 48 Room: [facility] ENDO ROOM 2 Gender: Female Note Status: Finalized Instrument Name: Upper Endoscope [id number] Procedure:             Upper GI endoscopy Indications:           Dysphagia Providers:             Quintin Buckle DO, DO Medicines:             Monitored Anesthesia Care Complications:         No immediate complications. Estimated blood loss:                         Minimal. Procedure:             Pre-Anesthesia Assessment:                        - Prior to the procedure, a History and Physical was                         performed, and patient medications and allergies were                         reviewed. The patient is competent. The risks and                         benefits of the procedure and the sedation options and                         risks were discussed with the patient. All questions                         were answered and informed consent was obtained.                         Patient identification and proposed procedure were                         verified by the physician, the nurse, the anesthetist                         and the technician in the endoscopy suite. Mental                         Status Examination: alert and oriented. Airway                         Examination: normal oropharyngeal airway and neck                         mobility. Respiratory Examination: clear to                         auscultation. CV Examination: RRR, no murmurs, no S3                         or S4. Prophylactic Antibiotics: The patient does not  require prophylactic antibiotics. Prior                         Anticoagulants: The patient has taken Eliquis                         (apixaban), last dose was 3 days prior to procedure.                          ASA Grade Assessment: III - A patient with severe                         systemic disease. After reviewing the risks and                         benefits, the patient was deemed in satisfactory                         condition to undergo the procedure. The anesthesia                         plan was to use monitored anesthesia care (MAC).                         Immediately prior to administration of medications,                         the patient was re-assessed for adequacy to receive                         sedatives. The heart rate, respiratory rate, oxygen                         saturations, blood pressure, adequacy of pulmonary                         ventilation, and response to care were monitored                         throughout the procedure. The physical status of the                         patient was re-assessed after the procedure.                        After obtaining informed consent, the endoscope was                         passed under direct vision. Throughout the procedure,                         the patient's blood pressure, pulse, and oxygen                         saturations were monitored continuously. The Endoscope                         was introduced through the mouth, and advanced to the  second part of duodenum. The upper GI endoscopy was                         accomplished without difficulty. The patient tolerated                         the procedure well. Findings:      The duodenal bulb, first portion of the duodenum and second portion of       the duodenum were normal. Estimated blood loss: none.      The entire examined stomach was normal. Estimated blood loss: none.      One benign-appearing, intrinsic mild (non-circumferential scarring)       stenosis was found 38 cm from the incisors. This stenosis measured 1.6       cm (inner diameter). The stenosis was traversed. A TTS dilator was       passed through  the scope. Dilation with a 15-16.5-18 mm balloon dilator       was performed to 16.5 mm and 18 mm. The dilation site was examined       following endoscope reinsertion and showed mild mucosal disruption.       Disruption solely after 18mm tts balloon. Estimated blood loss was       minimal.      Normal mucosa was found in the entire esophagus. Biopsies were obtained       from the proximal and distal esophagus with cold forceps for histology       of suspected eosinophilic esophagitis. Estimated blood loss was minimal.      The Z-line was regular. Estimated blood loss: none.      Esophagogastric landmarks were identified: the gastroesophageal junction       was found at 38 cm from the incisors. Impression:            - Normal duodenal bulb, first portion of the duodenum                         and second portion of the duodenum.                        - Normal stomach.                        - Benign-appearing esophageal stenosis. Dilated.                        - Normal mucosa was found in the entire esophagus.                        - Z-line regular.                        - Esophagogastric landmarks identified.                        - Biopsies were taken with a cold forceps for                         evaluation of eosinophilic esophagitis. Recommendation:        - Patient has a contact number available for  emergencies. The signs and symptoms of potential                         delayed complications were discussed with the patient.                         Return to normal activities tomorrow. Written                         discharge instructions were provided to the patient.                        - Discharge patient to home.                        - Resume previous diet.                        - Continue present medications.                        - Use Protonix (pantoprazole) 40 mg PO daily for 8                         weeks.                        -  Resume Eliquis (apixaban) at prior dose tomorrow.                         Refer to managing physician for further adjustment of                         therapy.                        - Await pathology results.                        - Repeat upper endoscopy for surveillance based on                         pathology results.                        - Return to referring physician as previously                         scheduled.                        - The findings and recommendations were discussed with                         the patient.                        - The findings and recommendations were discussed with                         the patient's family. Procedure Code(s):     --- Professional ---  [phone], Esophagogastroduodenoscopy, flexible,                         transoral; with transendoscopic balloon dilation of                         esophagus (less than 30 mm diameter)                        43239, 59, Esophagogastroduodenoscopy, flexible,                         transoral; with biopsy, single or multiple Diagnosis Code(s):     --- Professional ---                        K22.2, Esophageal obstruction                        R13.10, Dysphagia, unspecified CPT copyright 2022 American Medical Association. All rights reserved. The codes documented in this report are preliminary and upon coder review may  be revised to meet current compliance requirements. Attending Participation:      I personally performed the entire procedure. Polo Brisk, DO Quintin Buckle DO, DO 10/01/2023 1:49:21 PM This report has been signed electronically. Number of Addenda: 0 Note Initiated On: 10/01/2023 1:28 PM Estimated Blood Loss:  Estimated blood loss was minimal.      [facility]

## 2023-10-01 NOTE — Anesthesia Preprocedure Evaluation (Signed)
 Anesthesia Evaluation  Patient identified by MRN, date of birth, ID band Patient awake    Reviewed: Allergy & Precautions, H&P , NPO status , Patient's Chart, lab work & pertinent test results, reviewed documented beta blocker date and time   History of Anesthesia Complications (+) history of anesthetic complications  Airway Mallampati: II   Neck ROM: full    Dental  (+) Poor Dentition   Pulmonary neg pulmonary ROS, former smoker   Pulmonary exam normal        Cardiovascular Exercise Tolerance: Good negative cardio ROS Atrial Fibrillation  Rhythm:regular Rate:Normal  Current sinus rhythm following oblation. Risks of recurrence discussed with patient. ja   Neuro/Psych negative neurological ROS  negative psych ROS   GI/Hepatic negative GI ROS, Neg liver ROS,,,  Endo/Other  Hypothyroidism    Renal/GU negative Renal ROS  negative genitourinary   Musculoskeletal   Abdominal   Peds  Hematology negative hematology ROS (+)   Anesthesia Other Findings Past Medical History: No date: Atrial fibrillation (HCC)     Comment:  s/p ablation 09/2022; scheduled for repeat ablation               02/2023 No date: Azotemia     Comment:  elevated creatinine 1.1-1.2 No date: Complication of anesthesia     Comment:  felt sedation lasted several days in '90's w/knee               surgery No date: DVT (deep venous thrombosis) (HCC) No date: History of UTI     Comment:  h/o bladder and kidney infections No date: Hypothyroidism No date: 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     Comment:  stable per patient and no further follow up recommended No date: Pulmonary emboli (HCC) No date: Urge incontinence of urine     Comment:  h/o stress incontinence as well s/p sling x 2 Past Surgical History: 03/19/2021: ARTHROSCOPY SHOULDER DISTAL CLAVICULECTOMY W/DISTAL  ARTICULAR SURFACE     Comment:  LEFT SHOULDER ARTHROSCOPIC SUBACROMIAL DECOMPRESSION;                DISTAL CLAVICLE EXCISION; TENDON DEBRIDEMENT;               ARTHROSCOPIC BICEPS 2001: CHOLECYSTECTOMY 11/09/2016: CHONDROPLASTY; Right     Comment:  Procedure: RIGHT KNEE ARTHROSCOPY, PARTIAL MEDIAL               MENISCECOMTY, LATERAL TIBIAL PLATEAU SUBCHONDROPLASTY;                Surgeon: Genevie Kerns, MD;  Location: [facility];  Service: Orthopedics;  Laterality:               Right; 02/02/2022: COLONOSCOPY; N/A     Comment:  Procedure: COLONOSCOPY;  Surgeon: Quintin Buckle,              DO;  Location: [facility] ENDOSCOPY;  Service:               Gastroenterology;  Laterality: N/A; 01/10/2023: COMBINED HYSTEROSCOPY DIAGNOSTIC / D&C 06/25/2020: DILATION AND CURETTAGE, DIAGNOSTIC / THERAPEUTIC     Comment:  with Novasure ablation for abnormal vaginal bleeding 2725,3664: KNEE SURGERY; Right     Comment:  meniscus repair first-ACL repair in '98 No date: REMOVAL OF URINARY SLING No date: REVISION URINARY SLING 2011: THYROIDECTOMY No date: URETHRAL SLING   Reproductive/Obstetrics negative OB ROS  Anesthesia Physical Anesthesia Plan  ASA: 3  Anesthesia Plan: General   Post-op Pain Management:    Induction:   PONV Risk Score and Plan:   Airway Management Planned:   Additional Equipment:   Intra-op Plan:   Post-operative Plan:   Informed Consent: I have reviewed the patients History and Physical, chart, labs and discussed the procedure including the risks, benefits and alternatives for the proposed anesthesia with the patient or authorized representative who has indicated his/her understanding and acceptance.     Dental Advisory Given  Plan Discussed with: CRNA  Anesthesia Plan Comments:        Anesthesia Quick Evaluation

## 2023-10-01 NOTE — Interval H&P Note (Signed)
 History and Physical Interval Note: Preprocedure H&P from 10/01/23  was reviewed and there was no interval change after seeing and examining the patient.  Written consent was obtained from the patient after discussion of risks, benefits, and alternatives. Patient has consented to proceed with Esophagogastroduodenoscopy with possible intervention   10/01/2023 1:19 PM  Penny [name]  has presented today for surgery, with the diagnosis of Dysphagia, unspecified type (R13.10).  The various methods of treatment have been discussed with the patient and family. After consideration of risks, benefits and other options for treatment, the patient has consented to  Procedure(s) with comments: EGD (ESOPHAGOGASTRODUODENOSCOPY) (N/A) - Patient is on Eliquis and Wegovy as a surgical intervention.  The patient's history has been reviewed, patient examined, no change in status, stable for surgery.  I have reviewed the patient's chart and labs.  Questions were answered to the patient's satisfaction.     Quintin Buckle

## 2023-10-01 NOTE — H&P (Signed)
 Pre-Procedure H&P   Patient ID: Penny [name] is a 48 y.o. female.  Gastroenterology Provider: Quintin Buckle, DO  PCP: Jimmy Moulding, MD  Date: 10/01/2023  HPI Ms. Penny [name] is a 48 y.o. female who presents today for Esophagogastroduodenoscopy for Dysphagia .  Patient has had dysphagia to small pills.  She feels hanging in her mid sternum.  This does not occur daily and she does not experience globus sensation.  On a weekly basis she may have difficulty with solids.  No issues with liquids or odynophagia.  No nausea or vomiting. She is on Wegovy which has been held for the procedure (5/7- last dose).  She feels her symptoms started before this.  She is on Eliquis ( 5/16 pm dose) was been held for this procedure as well.  Status post cholecystectomy.  Status post ablation for atrial fibrillation.  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    stable per patient and no further follow up recommended   Pulmonary emboli (HCC)    Urge incontinence of urine    h/o stress incontinence as well s/p sling x 2    Past Surgical History:  Procedure Laterality Date   ARTHROSCOPY SHOULDER DISTAL CLAVICULECTOMY W/DISTAL ARTICULAR SURFACE  03/19/2021   LEFT SHOULDER ARTHROSCOPIC SUBACROMIAL DECOMPRESSION; DISTAL CLAVICLE EXCISION; TENDON DEBRIDEMENT; ARTHROSCOPIC BICEPS   CHOLECYSTECTOMY  2001   CHONDROPLASTY Right 11/09/2016   Procedure: RIGHT KNEE ARTHROSCOPY, PARTIAL MEDIAL MENISCECOMTY, LATERAL TIBIAL PLATEAU SUBCHONDROPLASTY;  Surgeon: Genevie Kerns, MD;  Location:  [facility] [place];  Service: Orthopedics;  Laterality: Right;   COLONOSCOPY N/A 02/02/2022   Procedure: COLONOSCOPY;  Surgeon: Quintin Buckle, DO;  Location: [facility] ENDOSCOPY;  Service: Gastroenterology;  Laterality: N/A;   COMBINED HYSTEROSCOPY DIAGNOSTIC / D&C  01/10/2023   DILATION AND CURETTAGE, DIAGNOSTIC / THERAPEUTIC  06/25/2020   with Novasure ablation for abnormal vaginal bleeding   KNEE SURGERY Right 6045,4098   meniscus repair first-ACL repair in '98   REMOVAL OF URINARY SLING     REVISION URINARY SLING     THYROIDECTOMY  2011   URETHRAL SLING      Family History Father- colon polyps No other h/o GI disease or malignancy  Review of Systems  Constitutional:  Negative for activity change, appetite change, chills, diaphoresis, fatigue, fever and unexpected weight change.  HENT:  Positive for trouble swallowing. Negative for voice change.   Respiratory:  Negative for shortness of breath and wheezing.   Cardiovascular:  Negative for chest pain, palpitations and leg swelling.  Gastrointestinal:  Negative for abdominal distention, abdominal pain, anal bleeding, blood in stool, constipation, diarrhea, nausea, rectal pain and vomiting.  Musculoskeletal:  Negative for arthralgias and myalgias.  Skin:  Negative for color change and pallor.  Neurological:  Negative for dizziness, syncope and weakness.  Psychiatric/Behavioral:  Negative for confusion.   All other systems reviewed and are negative.    Medications No current facility-administered medications on file prior to encounter.   Current Outpatient Medications on File Prior to Encounter  Medication Sig Dispense Refill   levothyroxine (SYNTHROID) 175 MCG tablet Take 175 mcg by mouth daily before breakfast.     apixaban (ELIQUIS) 5 MG TABS tablet Take 5 mg by mouth 2 (two) times daily.     cyclobenzaprine  (FLEXERIL ) 5 MG tablet Take 1 tablet (5 mg total) by mouth at bedtime. (Patient not taking: Reported on  02/07/2023) 10 tablet 0   diltiazem  (CARDIZEM ) 30 MG tablet TAKE 1 TABLET BY MOUTH 4 TIMES DAILY AS NEEDED (RESTING HR > 110BPM)     fenofibrate (TRICOR) 145 MG tablet Take 1 tablet by mouth at bedtime.     flecainide (TAMBOCOR) 50 MG tablet Take 100 mg by mouth 2 (two) times daily.     folic acid (FOLVITE) 1 MG tablet Take 1 mg by mouth daily.     gabapentin (NEURONTIN) 300 MG capsule Take 300 mg by mouth 3 (three) times daily. (Patient not taking: Reported on 02/07/2023)     ibuprofen (ADVIL,MOTRIN) 600 MG tablet Take 600 mg by mouth every 4 (four) hours as needed.     levothyroxine (SYNTHROID, LEVOTHROID) 300 MCG tablet Take 250 mcg by mouth daily.      metoprolol  tartrate (LOPRESSOR ) 25 MG tablet Take 1 tablet (25 mg total) by mouth as needed (For palpitations and atrial fibrillation). (Patient not taking: Reported on 02/07/2023) 12 tablet 0   tolterodine (DETROL LA) 2 MG 24 hr capsule Take 2 mg by mouth daily.     WEGOVY 0.5 MG/0.5ML SOAJ Inject into the skin.      Pertinent medications related to GI and procedure were reviewed by me with the patient prior to the procedure   Current Facility-Administered Medications:    0.9 %  sodium chloride  infusion, , Intravenous, Continuous, Quintin Buckle, DO  sodium chloride          Allergies  Allergen Reactions   Codeine Shortness Of Breath    Nausea,dizziness when took a dose by mistake-no breathing problem at that time   Prednisone      Hot flashes/flushing   Latex Rash   Sulfa Antibiotics Rash   Allergies were reviewed by me prior to the procedure  Objective   Body mass index is 29.82 kg/m. Vitals:   10/01/23 1304  BP: (!) 139/92  Resp: 20  Temp: (!) 96.6 F (35.9 C)  TempSrc: Tympanic  SpO2: 100%  Weight: 86.4 kg  Height: 5\' 7"  (1.702 m)     Physical Exam Vitals and nursing note reviewed.  Constitutional:      General: She is not in acute distress.    Appearance: Normal appearance. She is not ill-appearing,  toxic-appearing or diaphoretic.  HENT:     Head: Normocephalic and atraumatic.     Nose: Nose normal.     Mouth/Throat:     Mouth: Mucous membranes are moist.     Pharynx: Oropharynx is clear.  Eyes:     General: No scleral icterus.    Extraocular Movements: Extraocular movements intact.  Cardiovascular:     Rate and Rhythm: Normal rate and regular rhythm.     Heart sounds: Normal heart sounds. No murmur heard.    No friction rub. No gallop.  Pulmonary:     Effort: Pulmonary effort is normal. No respiratory distress.     Breath sounds: Normal breath sounds. No wheezing, rhonchi or rales.  Abdominal:     General: Bowel sounds are normal. There is no distension.     Palpations: Abdomen is soft.  Tenderness: There is no abdominal tenderness. There is no guarding or rebound.  Musculoskeletal:     Cervical back: Neck supple.     Right lower leg: No edema.     Left lower leg: No edema.  Skin:    General: Skin is warm and dry.     Coloration: Skin is not jaundiced or pale.  Neurological:     General: No focal deficit present.     Mental Status: She is alert and oriented to person, place, and time. Mental status is at baseline.  Psychiatric:        Mood and Affect: Mood normal.        Behavior: Behavior normal.        Thought Content: Thought content normal.        Judgment: Judgment normal.      Assessment:  Ms. Penny [name] is a 48 y.o. female  who presents today for Esophagogastroduodenoscopy for dysphagia.  Plan:  Esophagogastroduodenoscopy with possible intervention today  Esophagogastroduodenoscopy with possible biopsy, control of bleeding, polypectomy, and interventions as necessary has been discussed with the patient/patient representative. Informed consent was obtained from the patient/patient representative after explaining the indication, nature, and risks of the procedure including but not limited to death, bleeding, perforation, missed neoplasm/lesions,  cardiorespiratory compromise, and reaction to medications. Opportunity for questions was given and appropriate answers were provided. Patient/patient representative has verbalized understanding is amenable to undergoing the procedure.   Quintin Buckle, DO  [facility] Gastroenterology  Portions of the record may have been created with voice recognition software. Occasional wrong-word or 'sound-a-like' substitutions may have occurred due to the inherent limitations of voice recognition software.  Read the chart carefully and recognize, using context, where substitutions may have occurred.

## 2023-10-01 NOTE — Transfer of Care (Signed)
 Immediate Anesthesia Transfer of Care Note  Patient: Penny [name]  Procedure(s) Performed: EGD (ESOPHAGOGASTRODUODENOSCOPY)  Patient Location: PACU  Anesthesia Type:General  Level of Consciousness: awake and sedated  Airway & Oxygen Therapy: Patient Spontanous Breathing and Patient connected to nasal cannula oxygen  Post-op Assessment: Report given to RN and Post -op Vital signs reviewed and stable  Post vital signs: Reviewed and stable  Last Vitals:  Vitals Value Taken Time  BP    Temp    Pulse    Resp    SpO2      Last Pain:  Vitals:   10/01/23 1304  TempSrc: Tympanic         Complications: There were no known notable events for this encounter.

## 2023-10-02 ENCOUNTER — Encounter: Payer: Self-pay | Admitting: Gastroenterology

## 2023-10-02 LAB — SURGICAL PATHOLOGY

## 2023-10-03 NOTE — Anesthesia Postprocedure Evaluation (Signed)
 Anesthesia Post Note  Patient: Penny [name]  Procedure(s) Performed: EGD (ESOPHAGOGASTRODUODENOSCOPY)  Patient location during evaluation: PACU Anesthesia Type: General Level of consciousness: awake and alert Pain management: pain level controlled Vital Signs Assessment: post-procedure vital signs reviewed and stable Respiratory status: spontaneous breathing, nonlabored ventilation, respiratory function stable and patient connected to nasal cannula oxygen Cardiovascular status: blood pressure returned to baseline and stable Postop Assessment: no apparent nausea or vomiting Anesthetic complications: no   There were no known notable events for this encounter.   Last Vitals:  Vitals:   10/01/23 1350 10/01/23 1400  BP: (!) 116/58 128/82  Resp:    Temp: 36.8 C   SpO2:      Last Pain:  Vitals:   10/02/23 0743  TempSrc:   PainSc: 0-No pain                 Zula Hitch

## 2023-11-07 ENCOUNTER — Inpatient Hospital Stay: Payer: Medicaid Other
